# Patient Record
Sex: Female | Born: 1975 | Race: White | Hispanic: No | Marital: Single | Smoking: Never smoker
Health system: Southern US, Community
[De-identification: ages and names within clinical notes are randomized; demographics above are authoritative.]

## PROBLEM LIST (undated history)

## (undated) DIAGNOSIS — R519 Headache, unspecified: Secondary | ICD-10-CM

## (undated) DIAGNOSIS — O139 Gestational [pregnancy-induced] hypertension without significant proteinuria, unspecified trimester: Secondary | ICD-10-CM

## (undated) DIAGNOSIS — E282 Polycystic ovarian syndrome: Secondary | ICD-10-CM

## (undated) DIAGNOSIS — R51 Headache: Secondary | ICD-10-CM

## (undated) HISTORY — PX: OTHER SURGICAL HISTORY: SHX169

## (undated) HISTORY — PX: ARTHROSCOPIC REPAIR ACL: SUR80

---

## 2005-03-18 ENCOUNTER — Emergency Department: Payer: Self-pay | Admitting: Unknown Physician Specialty

## 2005-11-23 ENCOUNTER — Emergency Department: Payer: Self-pay | Admitting: Emergency Medicine

## 2007-06-02 ENCOUNTER — Emergency Department: Payer: Self-pay | Admitting: Unknown Physician Specialty

## 2007-10-21 ENCOUNTER — Emergency Department: Payer: Self-pay | Admitting: Emergency Medicine

## 2008-07-07 ENCOUNTER — Emergency Department: Payer: Self-pay | Admitting: Emergency Medicine

## 2009-06-14 ENCOUNTER — Emergency Department: Payer: Self-pay | Admitting: Emergency Medicine

## 2010-10-14 ENCOUNTER — Ambulatory Visit: Payer: Self-pay | Admitting: Family Medicine

## 2013-09-06 ENCOUNTER — Ambulatory Visit: Payer: Self-pay | Admitting: Family Medicine

## 2013-11-07 DIAGNOSIS — E663 Overweight: Secondary | ICD-10-CM | POA: Insufficient documentation

## 2014-02-27 ENCOUNTER — Emergency Department: Payer: Self-pay | Admitting: Emergency Medicine

## 2014-08-12 ENCOUNTER — Emergency Department: Payer: Self-pay | Admitting: Emergency Medicine

## 2014-08-12 LAB — COMPREHENSIVE METABOLIC PANEL
ALBUMIN: 3.8 g/dL (ref 3.4–5.0)
Alkaline Phosphatase: 79 U/L (ref 46–116)
Anion Gap: 9 (ref 7–16)
BUN: 12 mg/dL (ref 7–18)
Bilirubin,Total: 0.3 mg/dL (ref 0.2–1.0)
CALCIUM: 9.1 mg/dL (ref 8.5–10.1)
CHLORIDE: 110 mmol/L — AB (ref 98–107)
Co2: 21 mmol/L (ref 21–32)
Creatinine: 0.77 mg/dL (ref 0.60–1.30)
EGFR (African American): >60
EGFR (Non-African Amer.): >60
Glucose: 134 mg/dL — ABNORMAL HIGH (ref 65–99)
Osmolality: 281 (ref 275–301)
Potassium: 3.5 mmol/L (ref 3.5–5.1)
SGOT(AST): 32 U/L (ref 15–37)
SGPT (ALT): 46 U/L (ref 14–63)
SODIUM: 140 mmol/L (ref 136–145)
Total Protein: 7.8 g/dL (ref 6.4–8.2)

## 2014-08-12 LAB — CBC WITH DIFFERENTIAL/PLATELET
BASOS PCT: 0.8 %
Basophil #: 0.1 10*3/uL (ref 0.0–0.1)
Eosinophil #: 0.1 10*3/uL (ref 0.0–0.7)
Eosinophil %: 0.5 %
HCT: 40.3 % (ref 35.0–47.0)
HGB: 13.5 g/dL (ref 12.0–16.0)
LYMPHS ABS: 3 10*3/uL (ref 1.0–3.6)
LYMPHS PCT: 23.8 %
MCH: 31.9 pg (ref 26.0–34.0)
MCHC: 33.6 g/dL (ref 32.0–36.0)
MCV: 95 fL (ref 80–100)
MONOS PCT: 5.7 %
Monocyte #: 0.7 x10 3/mm (ref 0.2–0.9)
Neutrophil #: 8.8 10*3/uL — ABNORMAL HIGH (ref 1.4–6.5)
Neutrophil %: 69.2 %
Platelet: 276 10*3/uL (ref 150–440)
RBC: 4.24 10*6/uL (ref 3.80–5.20)
RDW: 12.2 % (ref 11.5–14.5)
WBC: 12.7 10*3/uL — ABNORMAL HIGH (ref 3.6–11.0)

## 2014-08-12 LAB — URINALYSIS, COMPLETE
Bacteria: NONE SEEN
Bilirubin,UR: NEGATIVE
Blood: NEGATIVE
GLUCOSE, UR: NEGATIVE mg/dL (ref 0–75)
Leukocyte Esterase: NEGATIVE
Nitrite: NEGATIVE
Ph: 8 (ref 4.5–8.0)
Protein: 30
Specific Gravity: 1.015 (ref 1.003–1.030)
WBC UR: NONE SEEN /HPF (ref 0–5)

## 2014-08-29 ENCOUNTER — Ambulatory Visit: Payer: Self-pay | Admitting: Urology

## 2014-09-05 ENCOUNTER — Ambulatory Visit: Payer: Self-pay

## 2014-10-11 ENCOUNTER — Ambulatory Visit
Admit: 2014-10-11 | Disposition: A | Discharge status: Home / Self Care | Payer: Self-pay | Attending: Urology | Admitting: Urology

## 2014-10-16 ENCOUNTER — Ambulatory Visit
Admit: 2014-10-16 | Disposition: A | Discharge status: Home / Self Care | Payer: Self-pay | Attending: Urology | Admitting: Urology

## 2014-10-16 LAB — CBC
HCT: 40.7 % (ref 35.0–47.0)
HGB: 13.5 g/dL (ref 12.0–16.0)
MCH: 31.5 pg (ref 26.0–34.0)
MCHC: 33.1 g/dL (ref 32.0–36.0)
MCV: 95 fL (ref 80–100)
Platelet: 237 10*3/uL (ref 150–440)
RBC: 4.29 10*6/uL (ref 3.80–5.20)
RDW: 12.6 % (ref 11.5–14.5)
WBC: 6.1 10*3/uL (ref 3.6–11.0)

## 2014-10-16 LAB — BASIC METABOLIC PANEL
Anion Gap: 9 (ref 7–16)
BUN: 15 mg/dL
CREATININE: 0.75 mg/dL
Calcium, Total: 8.8 mg/dL — ABNORMAL LOW
Chloride: 111 mmol/L
Co2: 20 mmol/L — ABNORMAL LOW
Glucose: 121 mg/dL — ABNORMAL HIGH
Potassium: 3.5 mmol/L
SODIUM: 140 mmol/L

## 2014-10-22 ENCOUNTER — Ambulatory Visit
Admit: 2014-10-22 | Disposition: A | Discharge status: Home / Self Care | Payer: Self-pay | Attending: Urology | Admitting: Urology

## 2014-10-24 ENCOUNTER — Observation Stay
Admit: 2014-10-24 | Disposition: A | Discharge status: Home / Self Care | Payer: Self-pay | Attending: Urology | Admitting: Urology

## 2014-10-24 LAB — CBC WITH DIFFERENTIAL/PLATELET
BASOS ABS: 0.1 10*3/uL (ref 0.0–0.1)
Basophil %: 0.6 %
Eosinophil #: 0.1 10*3/uL (ref 0.0–0.7)
Eosinophil %: 0.4 %
HCT: 40.4 % (ref 35.0–47.0)
HGB: 13.5 g/dL (ref 12.0–16.0)
Lymphocyte #: 1.6 10*3/uL (ref 1.0–3.6)
Lymphocyte %: 11 %
MCH: 32 pg (ref 26.0–34.0)
MCHC: 33.4 g/dL (ref 32.0–36.0)
MCV: 96 fL (ref 80–100)
MONO ABS: 0.7 x10 3/mm (ref 0.2–0.9)
Monocyte %: 4.8 %
NEUTROS PCT: 83.2 %
Neutrophil #: 11.8 10*3/uL — ABNORMAL HIGH (ref 1.4–6.5)
PLATELETS: 264 10*3/uL (ref 150–440)
RBC: 4.22 10*6/uL (ref 3.80–5.20)
RDW: 12.7 % (ref 11.5–14.5)
WBC: 14.2 10*3/uL — ABNORMAL HIGH (ref 3.6–11.0)

## 2014-10-24 LAB — BASIC METABOLIC PANEL
ANION GAP: 6 — AB (ref 7–16)
BUN: 15 mg/dL
Calcium, Total: 9 mg/dL
Chloride: 110 mmol/L
Co2: 25 mmol/L
Creatinine: 0.82 mg/dL
EGFR (African American): >60
EGFR (Non-African Amer.): >60
GLUCOSE: 107 mg/dL — AB
Potassium: 3.6 mmol/L
Sodium: 141 mmol/L

## 2014-10-24 LAB — URINALYSIS, COMPLETE
BACTERIA: NONE SEEN
Specific Gravity: 1.02 (ref 1.003–1.030)

## 2014-10-25 LAB — CBC WITH DIFFERENTIAL/PLATELET
BASOS ABS: 0.1 10*3/uL (ref 0.0–0.1)
Basophil %: 1 %
Eosinophil #: 0.2 10*3/uL (ref 0.0–0.7)
Eosinophil %: 2.2 %
HCT: 35.1 % (ref 35.0–47.0)
HGB: 11.5 g/dL — ABNORMAL LOW (ref 12.0–16.0)
LYMPHS PCT: 38.4 %
Lymphocyte #: 3.2 10*3/uL (ref 1.0–3.6)
MCH: 31.5 pg (ref 26.0–34.0)
MCHC: 32.7 g/dL (ref 32.0–36.0)
MCV: 96 fL (ref 80–100)
MONOS PCT: 9.8 %
Monocyte #: 0.8 x10 3/mm (ref 0.2–0.9)
NEUTROS PCT: 48.6 %
Neutrophil #: 4.1 10*3/uL (ref 1.4–6.5)
PLATELETS: 205 10*3/uL (ref 150–440)
RBC: 3.64 10*6/uL — AB (ref 3.80–5.20)
RDW: 12.9 % (ref 11.5–14.5)
WBC: 8.4 10*3/uL (ref 3.6–11.0)

## 2014-10-26 LAB — URINE CULTURE

## 2014-10-29 ENCOUNTER — Ambulatory Visit
Admit: 2014-10-29 | Disposition: A | Discharge status: Home / Self Care | Payer: Self-pay | Attending: Urology | Admitting: Urology

## 2014-11-07 ENCOUNTER — Ambulatory Visit
Admit: 2014-11-07 | Disposition: A | Discharge status: Home / Self Care | Payer: Self-pay | Attending: Obstetrics and Gynecology | Admitting: Obstetrics and Gynecology

## 2014-11-11 NOTE — Discharge Summary (Signed)
Dates of Admission and Diagnosis:  Date of Admission 24-Oct-2014   Date of Discharge 01-Jan-0001   Admitting Diagnosis Right flank pain; Right hydronephrosis   Final Diagnosis Right flank pain; Right hydronephrosis    Chief Complaint/History of Present Illness Patient is a 39yo female who presented yesterday with acute right flank pain.  She is s/p right ureteroscopy 10/22/14 by Dr. Elnoria Howard for removal of 31m distal ureteral stone.  Right ureteral stent was removed yesterday in office by Dr. HElnoria Howardand patient reports pain began a few after visit.  Her WBC 14, Cr 0.83, and UA showing TNTC RBC and WBCs on admission.  Renal UKore[place]onstrated moderately severe right hydronephrosis, slightly increased from previous CT scan.  She was admitted overnight for observation and pain control.   Allergies:  Codeine: Itching, Rash, N/V/Diarrhea  Hydrocodone: Itching    Routine Chem:  13-Apr-16 15:38   Result Comment UA DIPSTICK - Unable to obtain valid dipstick results  - due to interference of gross blood in the  - specimen.  Result(s) reported on 24 Oct 2014 at 04:06PM.    16:56   Glucose, Serum  107 (65-99 NOTE: New Reference Range  09/18/14)  BUN 15 (6-20 NOTE: New Reference Range  09/18/14)  Creatinine (comp) 0.82 (0.44-1.00 NOTE: New Reference Range  09/18/14)  Sodium, Serum 141 (135-145 NOTE: New Reference Range  09/18/14)  Potassium, Serum 3.6 (3.5-5.1 NOTE: New Reference Range  09/18/14)  Chloride, Serum 110 (101-111 NOTE: New Reference Range  09/18/14)  CO2, Serum 25 (22-32 NOTE: New Reference Range  09/18/14)  Calcium (Total), Serum 9.0 (8.9-10.3 NOTE: New Reference Range  09/18/14)  Anion Gap  6  eGFR (African American) >60  eGFR (Non-African American) >60 (eGFR values <629mmin/1.73 m2 may be an indication of chronic kidney disease (CKD). Calculated eGFR is useful in patients with stable renal function. The eGFR calculation will not be reliable in acutely ill  patients when serum creatinine is changing rapidly. It is not useful in patients on dialysis. The eGFR calculation may not be applicable to patients at the low and high extremes of body sizes, pregnant women, and vegetarians.)  Routine UA:  13-Apr-16 15:38   Color (UA) RED  Clarity (UA) CLOUDY  Glucose (UA) see comment  Bilirubin (UA) see comment  Ketones (UA) see comment  Specific Gravity (UA) 1.020  Blood (UA) see comment  pH (UA) see comment  Protein (UA) see comment  Nitrite (UA) SEE COMMENT  Leukocyte Esterase (UA) see comment  RBC (UA) TNTC  WBC (UA) TNTC  Bacteria (UA) NONE SEEN  Epithelial Cells (UA) 0-5  Mucous (UA) PRESENT (Result(s) reported on 24 Oct 2014 at 04:06PM.)  Routine Hem:  13-Apr-16 16:56   WBC (CBC)  14.2  RBC (CBC) 4.22  Hemoglobin (CBC) 13.5  Hematocrit (CBC) 40.4  Platelet Count (CBC) 264  MCV 96  MCH 32.0  MCHC 33.4  RDW 12.7  Neutrophil % 83.2  Lymphocyte % 11.0  Monocyte % 4.8  Eosinophil % 0.4  Basophil % 0.6  Neutrophil #  11.8  Lymphocyte # 1.6  Monocyte # 0.7  Eosinophil # 0.1  Basophil # 0.1 (Result(s) reported on 24 Oct 2014 at 05:16PM.)  14-Apr-16 05:40   WBC (CBC) 8.4  RBC (CBC)  3.64  Hemoglobin (CBC)  11.5  Hematocrit (CBC) 35.1  Platelet Count (CBC) 205  MCV 96  MCH 31.5  MCHC 32.7  RDW 12.9  Neutrophil % 48.6  Lymphocyte % 38.4  Monocyte % 9.8  Eosinophil %  2.2  Basophil % 1.0  Neutrophil # 4.1  Lymphocyte # 3.2  Monocyte # 0.8  Eosinophil # 0.2  Basophil # 0.1 (Result(s) reported on 25 Oct 2014 at [facility].)   PERTINENT RADIOLOGY STUDIES: [place]:    13-Apr-16 17:36, US Kidney Bilateral  US Kidney Bilateral   REASON FOR EXAM:    had ureteral stent removed today not w/ return of   severe pain on r most c/w rena  COMMENTS:       PROCEDURE: [place]  - US KIDNEY  - Oct 24 2014  5:36PM     CLINICAL DATA:  Right-sided pain.  Removal of double-J stent    EXAM:  RENAL/URINARY TRACT ULTRASOUND  COMPLETE    COMPARISON:  Renal ultrasound October 11, 2014; CT abdomen and pelvis  August 12, 2014    FINDINGS:  Right Kidney:    Length: 12.4 cm. Echogenicity and renal cortical thickness are  within normal limits. No mass orperinephric fluid visualized. There  is moderately severe hydronephrosis on the right without  demonstrable ureterectasis. No calculus appreciable.    Left Kidney:    Length: 12.4 cm. Echogenicity and renal cortical thickness are  within normal limits. No mass perinephric fluid, or hydronephrosis  visualized. No sonographically demonstrable calculus or  ureterectasis.    Bladder:  Appears normal for degree of bladder distention. Flow from each  ureter is seen in the urinary bladder by Doppler evaluation.     IMPRESSION:  Moderately severe hydronephrosis on the right, increased from most  recent CT sonographic examination and similar to prior CT. Study  otherwise unremarkable.      Electronically Signed    By: Lowella Grip III M.D.    On:10/24/2014 18:41         Verified By: Leafy Kindle. WOODRUFF, M.D.,   Pertinent Past History:  Pertinent Past History 1) Depression/Anxiety 2) Migraines 3) Renal stones   Hospital Course:  Hospital Course Uncomplicated.  Patient reports continued right flank soreness but states that it has improved from 10/10 now to 6/10.  WBC down from 14 to 8.  If patient can tolerate po and pain continues to improve she will be discharged with close outpatient f/u. We will obtain CT A/P without contrast in 4 days 10/29/14 and follow up in our office for recheck and review results mid to late next week. Patient to seek immediate medical attention for fevers, difficulty urinating or uncontrolled pain/vomiting.   Condition on Discharge Fair   Code Status:  Code Status Full Code   DISCHARGE INSTRUCTIONS HOME MEDS:  Medication Reconciliation: Patient's Home Medications at Discharge:     Medication Instructions  lexapro 10  mg oral tablet  1 tab(s) orally once a day   zofran odt 4 mg oral tablet, disintegrating  1 tab(s) orally 3 times a day, As Needed   topiramate extended release 150 mg oral capsule, extended release  1 cap(s) orally once a day (at bedtime)   flomax 0.4 mg oral capsule  1 cap(s) orally once a day   oxybutynin 5 mg oral tablet  1 tab(s) orally 3 times a day - for Bladder Spasms   dilaudid 2 mg oral tablet  1 tab(s) orally every 4 hours as needed for pain   colace sodium 100 mg oral capsule  1 cap(s) orally 2 times a day to prevent constipation while taking pain medications   cipro 500 mg oral tablet  1 tab(s) orally every 12 hours   acetaminophen 325 mg oral  tablet  2 tab(s) orally every 4 hours, As needed, mild pain (1-3/10) or temp. greater than 100.4    PRESCRIPTIONS: PRINTED AND PLACED ON CHART   Physician's Instructions:  Home Health? No   Treatments None   Home Oxygen? No   Diet Regular   Dietary Supplements None   Diet Consistency Regular Consistency   Activity Limitations As tolerated   Return to Work after follow up visit with MD   Time frame for Follow Up Appointment 1-2 weeks  1 week after repeat renal ultrasound at [facility](Ordered): [place], [address], [place], [place], [place] [address], [place]:  Total Time: 30 minutes or less   Electronic Signatures: Sherlynn Stalls (MD)   (Signed 14-Apr-16 12:21)  Co-Signer: ADMISSION DATE AND DIAGNOSIS, CHIEF COMPLAINT/HPI, PERTINENT LABS, [place], PATIENT INSTRUCTIONS, Allergies, PERTINENT PAST HISTORY, TIME SPENT, Follow Up Physician Herbert Moors (NP)   (Signed 14-Apr-16 11:20)  Authored: ADMISSION DATE AND DIAGNOSIS, CHIEF COMPLAINT/HPI, PERTINENT LABS, PERTINENT RADIOLOGY STUDIES, HOSPITAL COURSE, DISCHARGE INSTRUCTIONS HOME MEDS, PATIENT INSTRUCTIONS, Allergies, PERTINENT  PAST HISTORY, TIME SPENT, Follow Up Physician  Last Updated: 14-Apr-16 12:21 by Sherlynn Stalls (MD)

## 2014-11-11 NOTE — H&P (Signed)
PATIENT NAME:  Rita White, Rita White MR#:  [id number] DATE OF BIRTH:  1975/07/23  DATE OF ADMISSION:  10/24/2014  ADMITTING PHYSICIAN:  Vanna Scotland, MD.    REQUESTING CONSULTANT:  Dorothea Glassman, MD from the Emergency Room.   REASON FOR ADMISSION:  Right flank pain, right hydronephrosis.   HISTORY OF PRESENT ILLNESS:  This is a 39 year old female who presented in late January with acute onset right flank pain, found to have a 4 mm distal ureteral stone. This stone failed to pass and she ultimately underwent a right ureteroscopy, laser lithotripsy, right ureteral stent placement on Monday 10/22/2014 with Dr. Edwyna Shell. She subsequently returned to the office today and underwent cystoscopy, stent removal, which was performed with minimal difficulty. She did not receive any antibiotics at time of stent removal. Several hours later she developed acute onset severe right flank pain and ultimately presented to the Emergency Room. She denies any fevers, chills, nausea, or vomiting. Her pain has been somewhat difficult to control in the ER requiring several doses of IV morphine. She does have a slightly elevated white count to 14.2, creatinine at 0.83. Her UA is grossly red with too numerous to count red and white cells, but no obvious bacteria or epithelials. She did also undergo a renal ultrasound which shows a moderately severe hydronephrosis on the right, slightly increased from a previous CT scan.   She denies any significant urinary symptoms other than occasional bladder spasms and gross hematuria.   PAST MEDICAL HISTORY:   1.  Depression/anxiety.   2.  Migraines.   PAST SURGICAL HISTORY:  1.  Right ureteroscopy, laser lithotripsy on 10/22/2014.  2.  Knee surgery.  3.  LEEP procedure.   ALLERGIES: CODEINE, HYDROCODONE.   HOME MEDICATIONS:  1.  Lexapro 10 mg daily.  2   Topiramate 500 mg at bedtime.    SOCIAL HISTORY: The patient is a nonsmoker. She drinks occasional social alcohol. No drug use.    FAMILY HISTORY: The patient does have a positive family history of upper tract urothelial carcinoma as well as kidney stones in her mother.   REVIEW OF SYSTEMS: A 12. review of systems was performed and is otherwise negative other than as per HPI.    PHYSICAL EXAMINATION:   VITAL SIGNS: Temperature 97.3, pulse 67, respirations 18, blood pressure 123/77, 99% on room air.  GENERAL: No acute distress, alert and oriented.  HEENT: Moist mucous membranes. Normocephalic, atraumatic.  NECK: Supple. No masses. Trachea is midline.  LUNGS: Clear to auscultation bilaterally.  HEART: Regular rate and rhythm. No lower extremity edema noted.  ABDOMEN: Soft. Mild tenderness in right lower quadrant with voluntary guarding. No rebound or rigidity. Slight right CVA tenderness.  SKIN: No rashes, lesions.  Bruise in the right forearm from previous IV.  NEUROLOGIC: Cranial nerves grossly intact. Moving all 4 extremities. No focal deficits.  EXTREMITIES: No lower extremity edema bilaterally.  LYMPHATIC SYSTEM:  No inguinal or axillary cervical lymphadenopathy noted.   LABORATORY DATA: As per HPI,  renal ultrasound from today was reviewed which again shows moderately severe hydronephrosis on the right slightly increased from previous CT scan and there are no stones noted within each of the kidneys, of note there was also no perinephric fluid visualized. The images were reviewed personally by myself.   ASSESSMENT AND PLAN: This is a 39 year old female with a right ureteral stone status post ureteroscopy, laser lithotripsy, and stent placement followed by stent removal 2 days later, she developed severe acute right flank pain  following stent removal this afternoon, and renal ultrasound shows a moderate to severe right hydronephrosis. She does have a borderline white count and her urine is somewhat suspicious, although it is difficult to make an assessment following this procedure. I suspect she has some ureteral edema  related to a chronically impacted stone and subsequent stent removal with resulting hydronephrosis. Given her poorly controlled pain and borderline white count I feel that it is reasonable to admit her for IV antibiotics and observation. If she feels improved we will plan for a CT scan to assess for any residual stone fragments, although I think this is unlikely. If her pain remains severe we will plan for ureteral stent placement tomorrow and we will make her n.p.o. at midnight. I have discussed this plan with the attending in the Emergency Room.     ____________________________ Claris GladdenAshley J. Loren Sawaya, MD ajb:bu D: 10/24/2014 20:12:14 ET T: 10/24/2014 20:27:31 ET JOB#: [id number]  cc: Claris GladdenAshley J. Tamana Hatfield, MD, <Dictator> Claris GladdenASHLEY J Henry Demeritt MD ELECTRONICALLY SIGNED 11/01/2014 14:43

## 2014-11-11 NOTE — Op Note (Signed)
PATIENT NAME:  Gertie ExonCHURCH, Tavonna M MR#:  [id number] DATE OF BIRTH:  1976/03/17  DATE OF PROCEDURE:  10/22/2014  PREOPERATIVE DIAGNOSIS: Distal right ureteral calculus.   POSTOPERATIVE DIAGNOSIS:  Distal right ureteral calculus with calculus gone.   PROCEDURE: Cystourethroscopy on the right, left retrograde pyelogram, and right ureteral stent with right ureteral dilatation.   ANESTHESIA: General.   FINDINGS ARE AS FOLLOWS:  With the patient sterilely prepped and draped in supine lithotomy position, after an appropriate timeout, I instrumented the bladder through the urethra with a 21 French Foroblique lens cystoscope. What was suspected to be a calculus was seen in the distal right and we bypassed this calcification with a wire. A wire goes up easily, so no obstruction. So, I do a cystoscopy.  There was a lot of edema where the calculus was.  So, I put a 2nd wire in through the ureteroscope which was a 7 [place] telescoping direct vision ureteroscope and up over the wire.  The ureter seemed to be clear of calculi. I go up twice, once after this initial time over the wire, and a second time after dilating the distal ureter which is quite edematous.  I find no calculi. I put a stent in over the wire which is a 0.035 sensor wire.  I decided to do a left retrograde pyelograms.  The ureters were small, thin, tiny.  No swelling, edema, or evidence of stone. The patient complained of some vague left-sided pain.  Thought she may have made had a calculus on the left.  When this is done, I am pleased with the position and stent and bladder is emptied.  Thirty mL of Marcaine were placed in the bladder and the patient is sent to recovery in satisfactory condition with a B and O suppository. She was given intraoperatively 15 mg of Toradol IV. This should help with pain control. The stent can be pulled out.   ____________________________ Caralyn Guileichard D. Edwyna ShellHart, DO rdh:sp D: 10/22/2014 12:13:54 ET T: 10/22/2014 16:23:47  ET JOB#: [id number]  cc: Caralyn Guileichard D. Edwyna ShellHart, DO, <Dictator> RICHARD D HART DO ELECTRONICALLY SIGNED 11/05/2014 10:24

## 2015-01-22 ENCOUNTER — Encounter: Payer: Self-pay | Admitting: Urology

## 2015-01-22 ENCOUNTER — Ambulatory Visit (INDEPENDENT_AMBULATORY_CARE_PROVIDER_SITE_OTHER): Payer: Medicaid Other | Admitting: Urology

## 2015-01-22 DIAGNOSIS — R350 Frequency of micturition: Secondary | ICD-10-CM

## 2015-01-22 DIAGNOSIS — R351 Nocturia: Secondary | ICD-10-CM

## 2015-01-22 DIAGNOSIS — N1330 Unspecified hydronephrosis: Secondary | ICD-10-CM

## 2015-01-22 NOTE — Progress Notes (Signed)
   01/22/2015 7:54 PM   Lissy M Sabey 10/15/1975 [id number]  Referring provider: Titus MouldElizabeth Burney White, NP [address] [address] [place], [place] [address]  Chief Complaint  Patient presents with  . Hydronephrosis    f/u RUS in 2 months to ensure complete resolution of hydronephrosis, fullness. The pt appt was cancelled &rescheduled today, because she was asymptomic and she had not had her RUS.     Patient not seen today, did not get ultrasound which was purpose of visit to assess for full resolution of residual right renal fullness.  No complains today, feeling well. Ultrasound ordered and arranged.  Plan to call patient with results of ultrasound.    Vanna ScotlandAshley Briauna Gilmartin, MD

## 2015-02-26 ENCOUNTER — Ambulatory Visit: Admission: RE | Admit: 2015-02-26 | Payer: Medicaid Other | Source: Ambulatory Visit

## 2015-09-14 IMAGING — CR DG ABDOMEN 1V
1 series · 2 of 2 positions shown · non-contrast
Comparison: CT scan of the abdomen and pelvis of August 12, 2014.

CLINICAL DATA: Lower abdominal and right flank pain, possible
urinary tract stones, clinical constipation possibly related to pain
medication

EXAM:
ABDOMEN - 1 VIEW

[Series 1: kdxr kidney ureter bladder · 0.14mm/px · 2 of 2 slices shown]
[im 1/2]
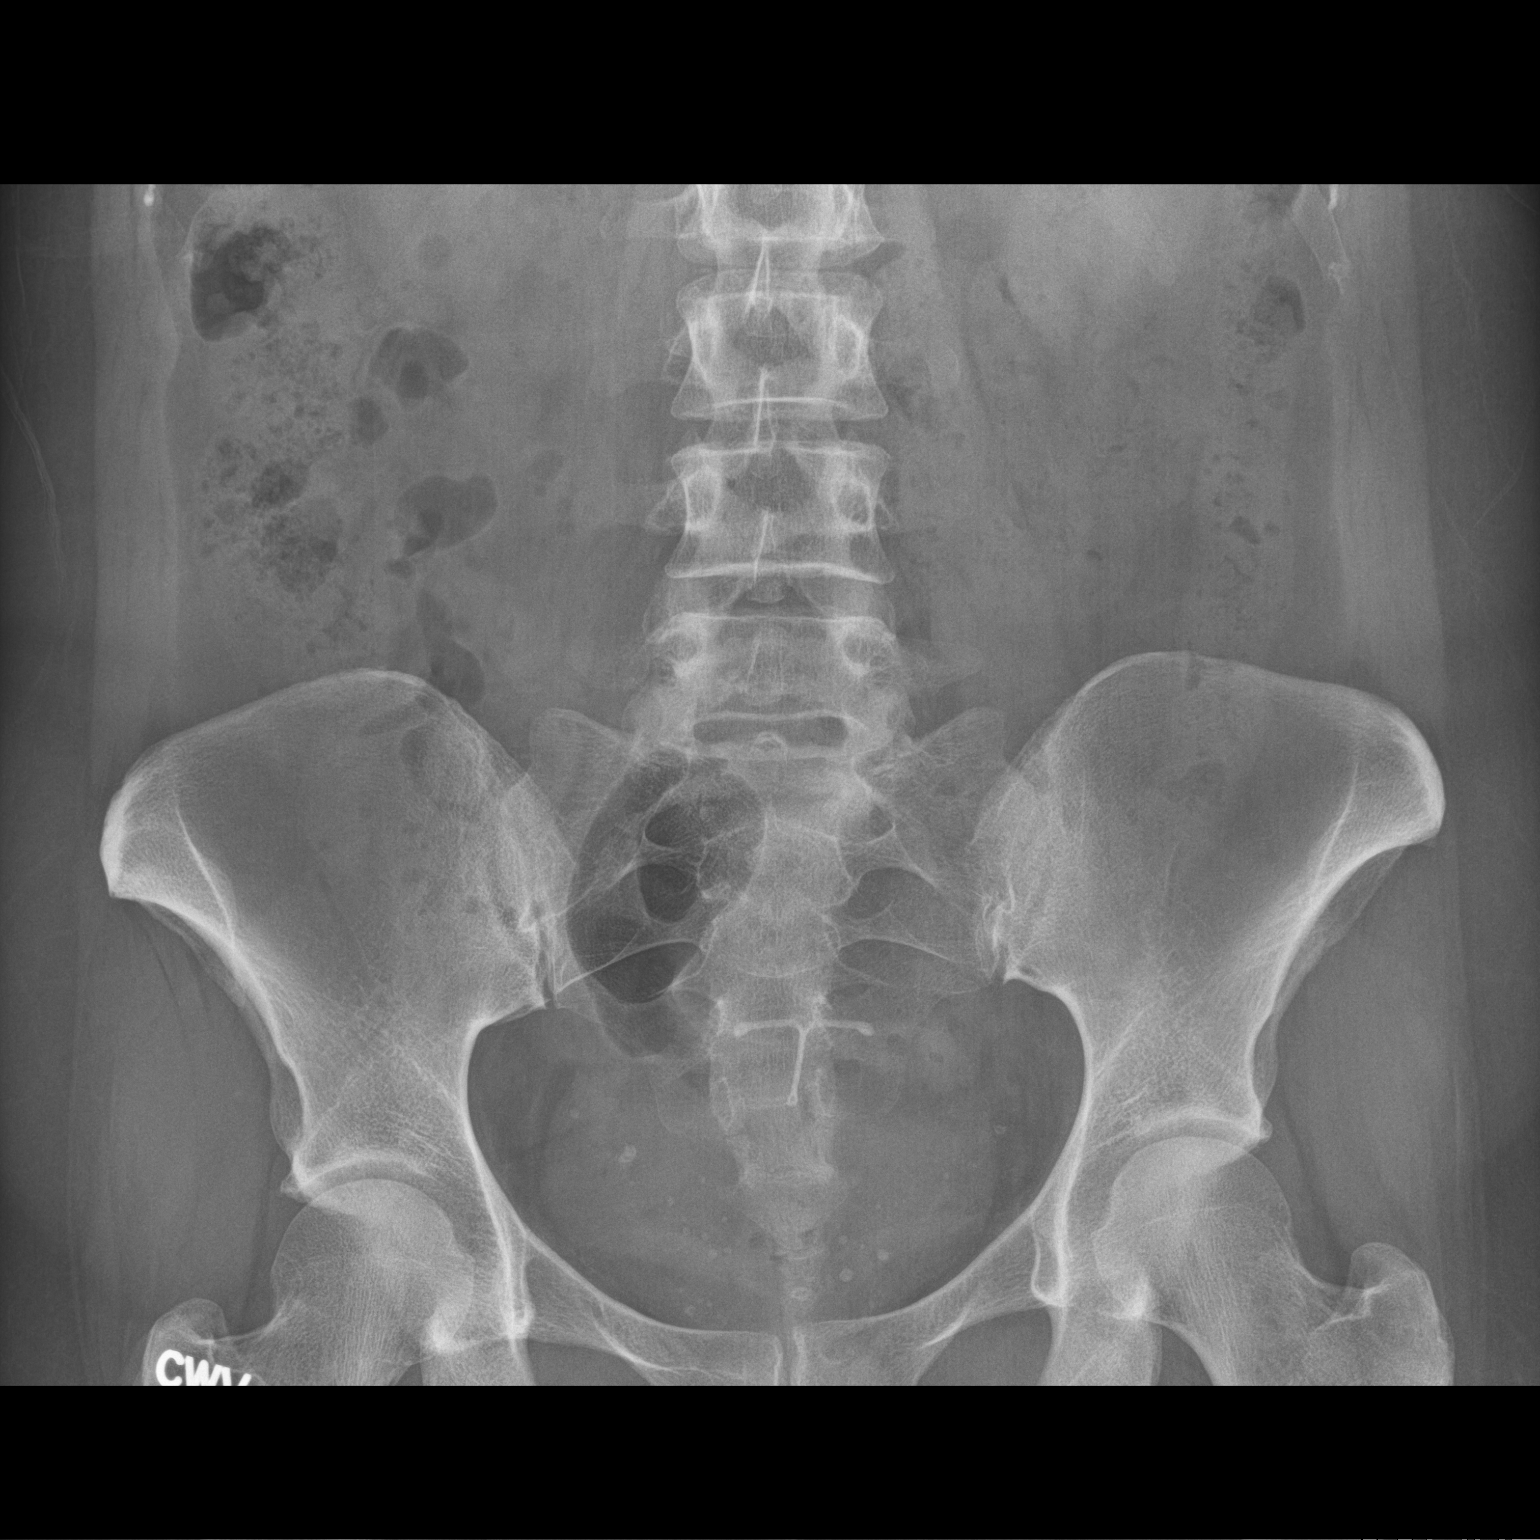
[im 2/2]
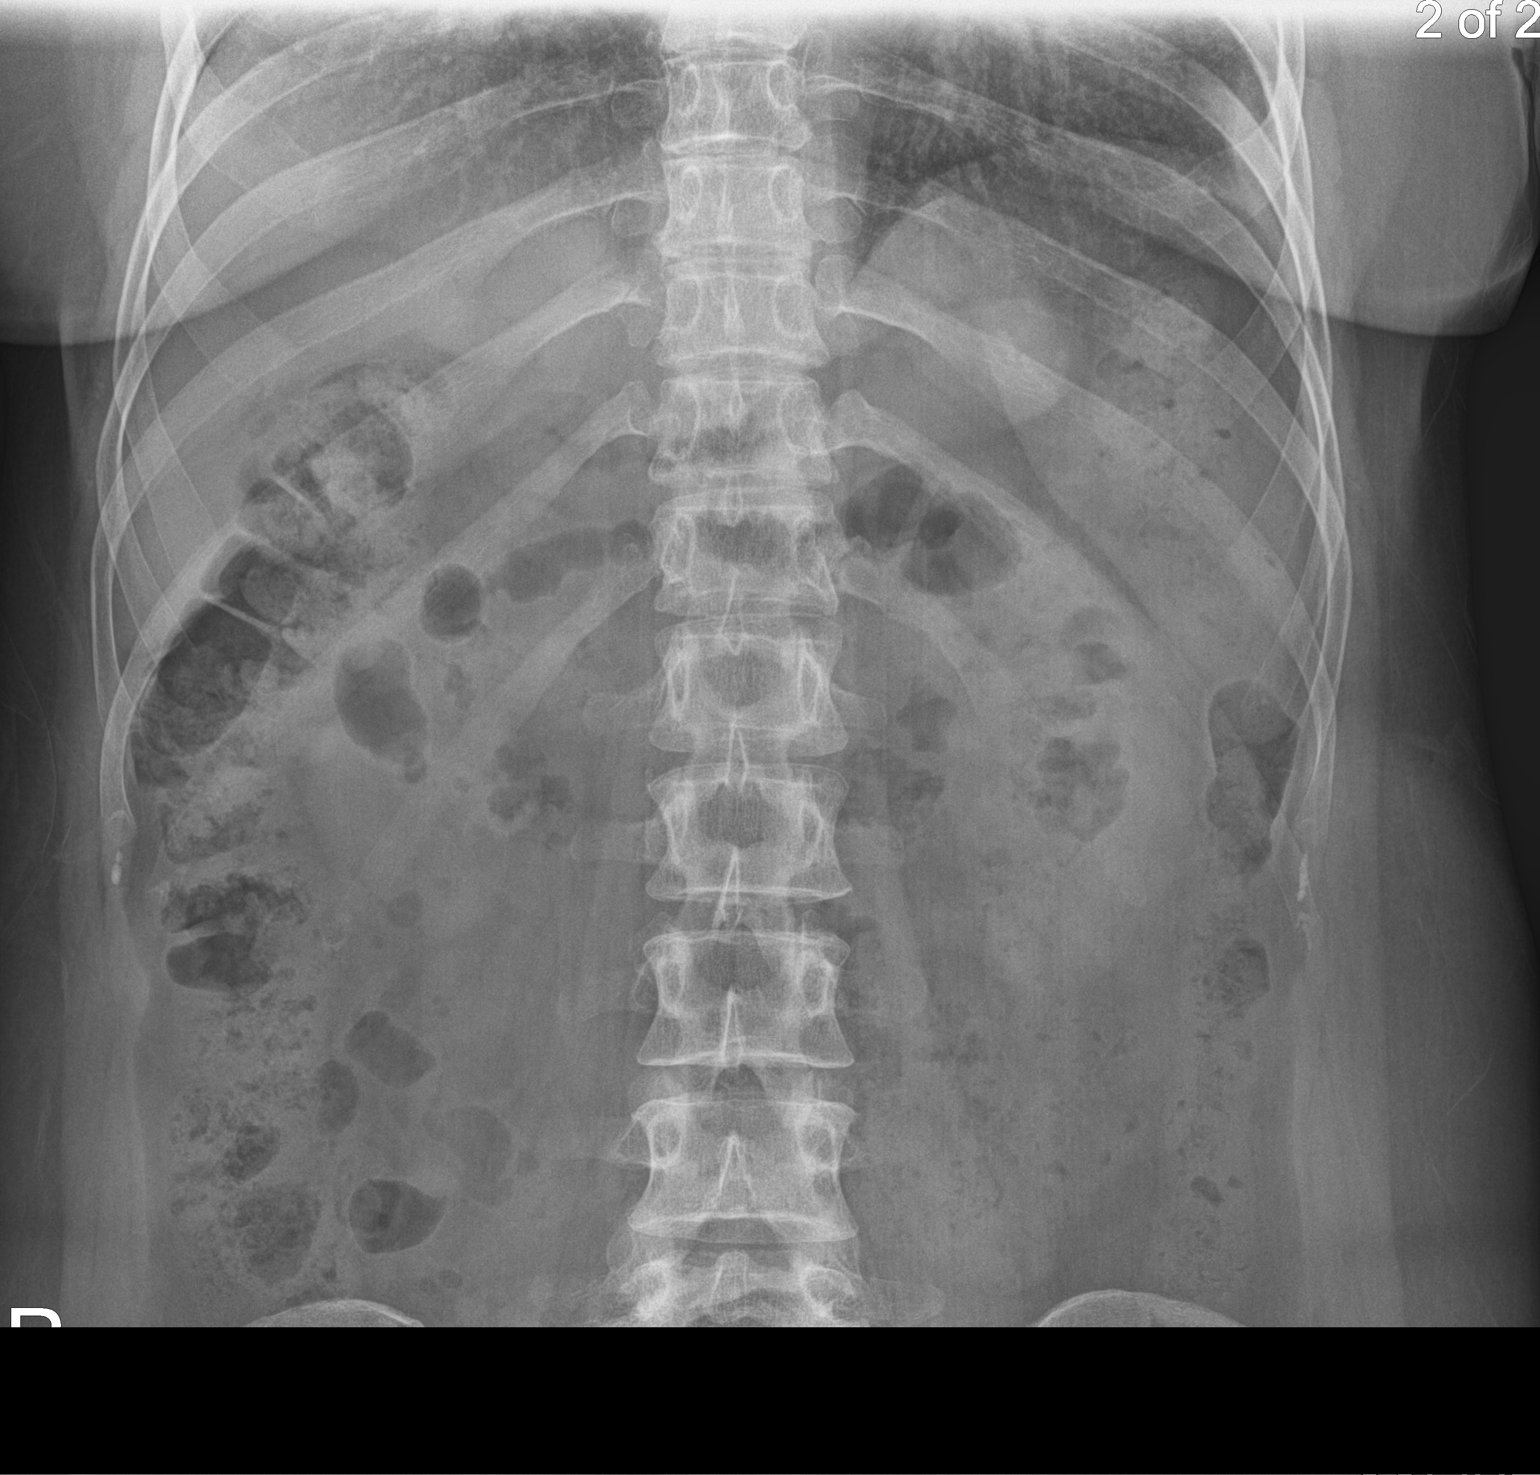

[2 of 2 positions shown; findings below may reference images not displayed]

FINDINGS: There is moderately increased stool burden throughout the colon.
There is no evidence of a large or small bowel obstruction. There
are numerous calcifications within the pelvis consistent with
phleboliths. A previously demonstrated stone at the left ureteral
right ureterovesical junction cannot be separated from adjacent
phleboliths. An IUD is present. The lumbar spine and bony pelvis are
unremarkable.
IMPRESSION: 1. Increased colonic stool burden consistent with clinical
constipation. There is no bowel abnormality otherwise.
2. Multiple pelvic phleboliths. A discrete distal ureteral stone is
not visible. Given the previous right-sided hydronephrosis,
follow-up renal ultrasound would be useful to assess the status of
the right kidney.

## 2015-09-21 IMAGING — US US RENAL KIDNEY
1 series · 14 of 25 positions shown · non-contrast
Comparison: CT scan from 08/12/2014.

CLINICAL DATA: Subsequent encounter for right-sided hydronephrosis.

EXAM:
RENAL/URINARY TRACT ULTRASOUND COMPLETE

[Series 1: us renal kidney · 0.20mm/px · 14 of 44 slices shown]
[im 1/44]
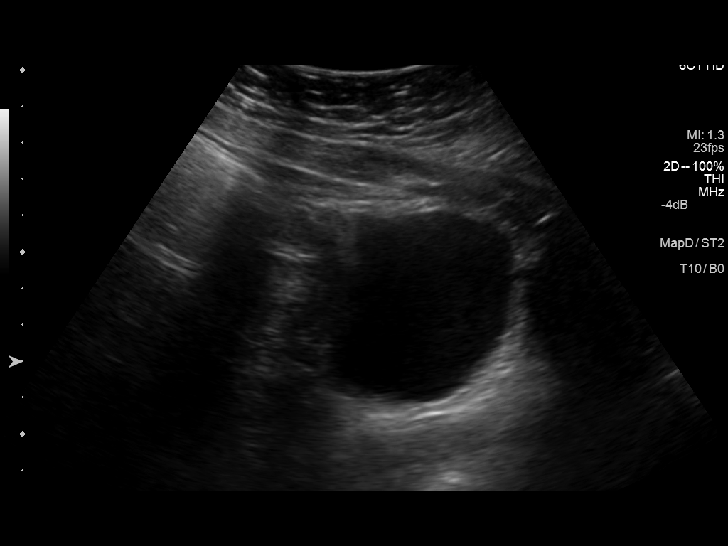
[im 4/44]
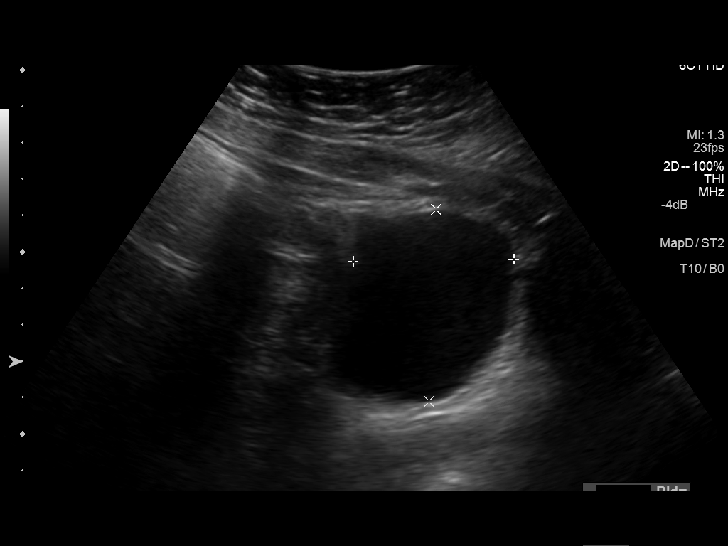
[im 8/44]
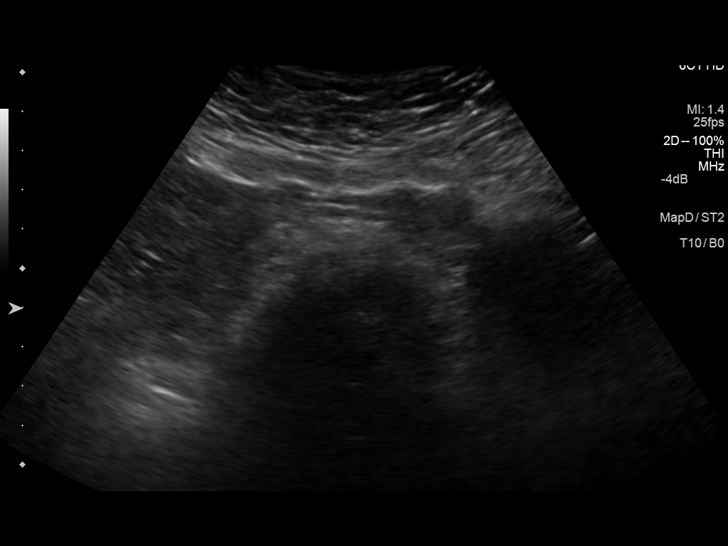
[im 11/44]
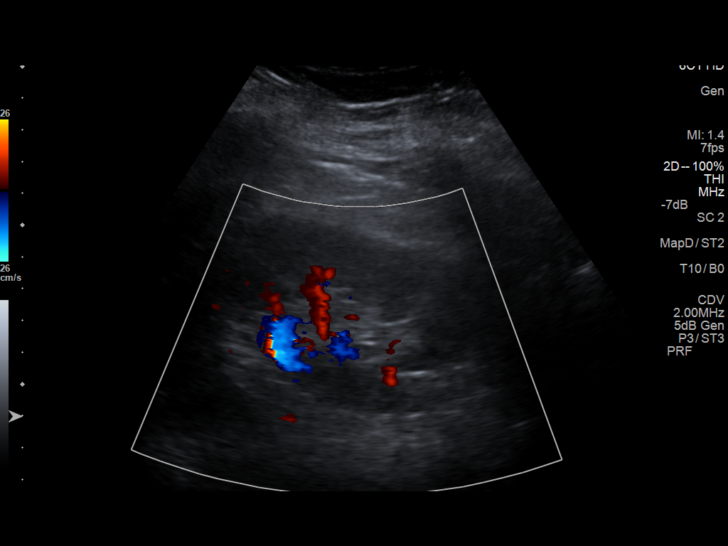
[im 15/44]
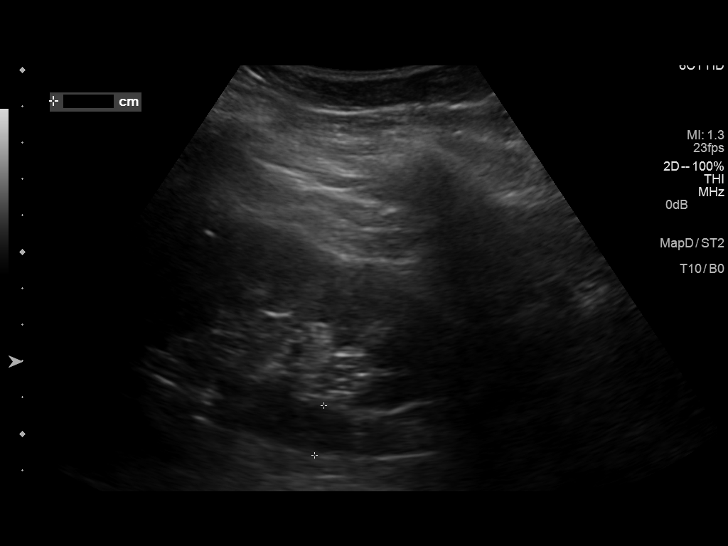
[im 17/44]
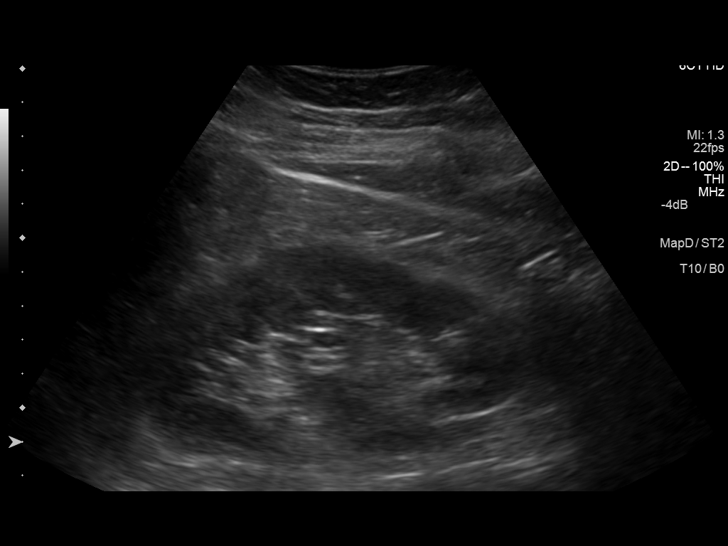
[im 20/44]
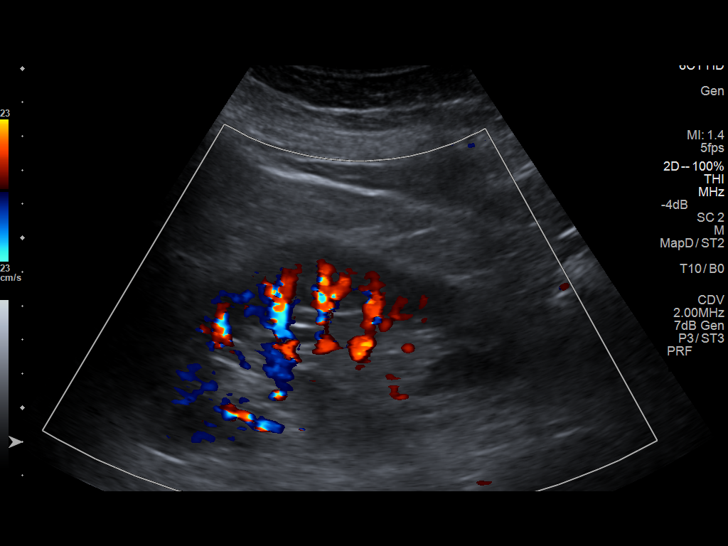
[im 24/44]
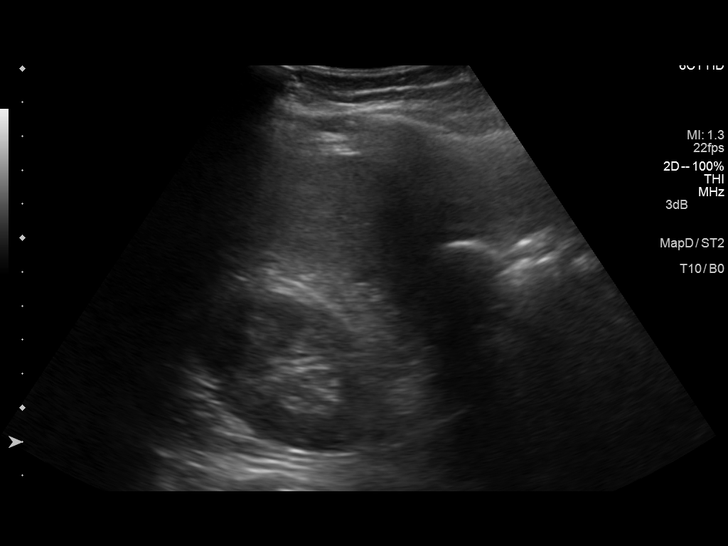
[im 27/44]
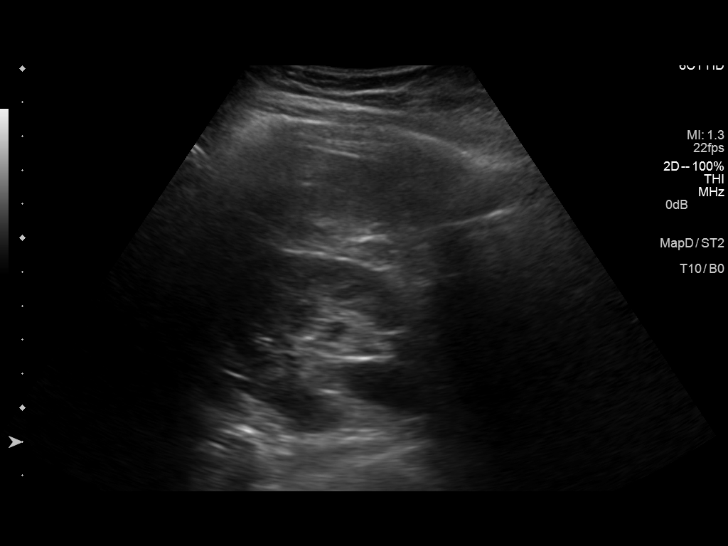
[im 29/44]
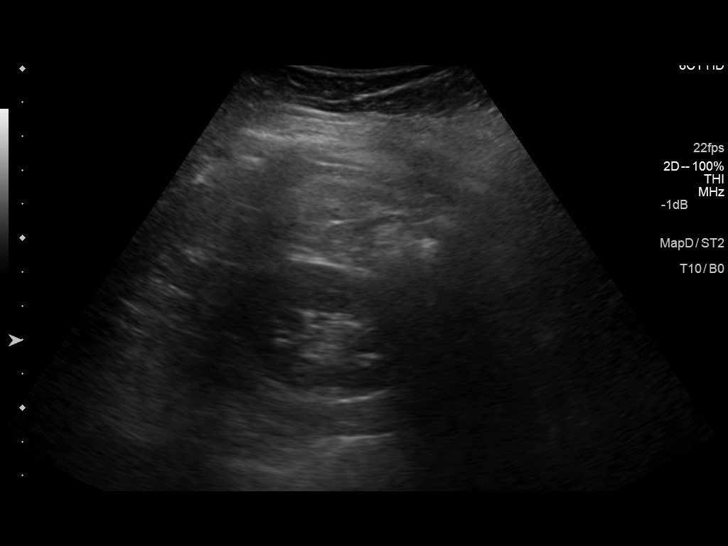
[im 33/44]
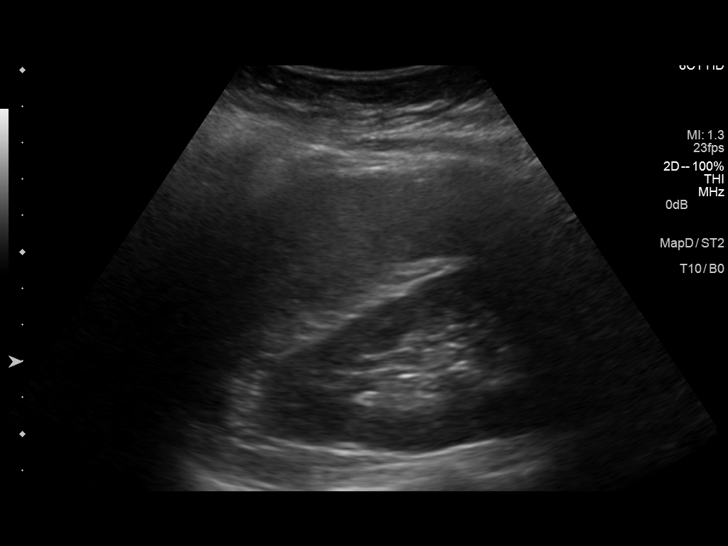
[im 36/44]
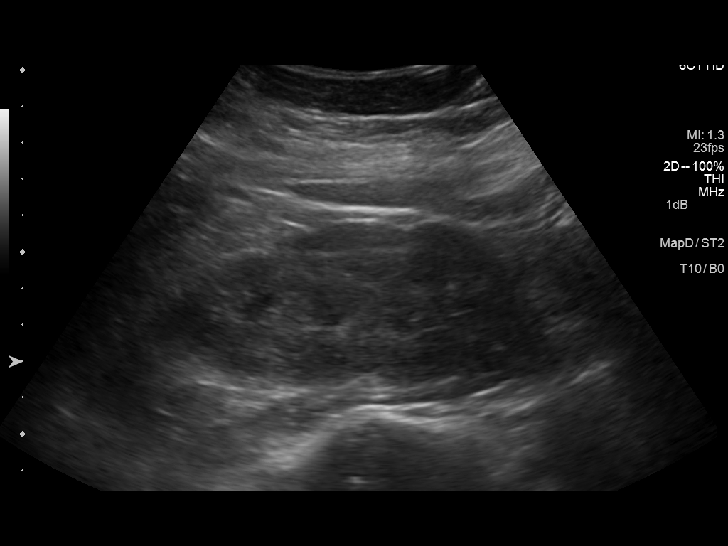
[im 40/44]
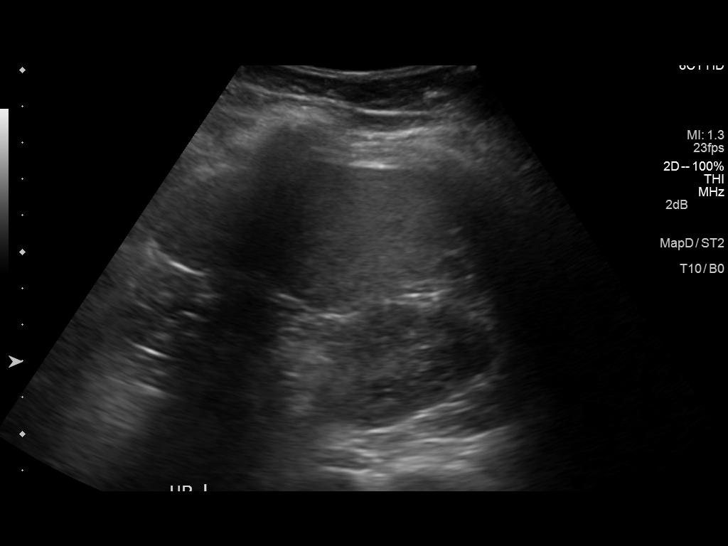
[im 44/44]
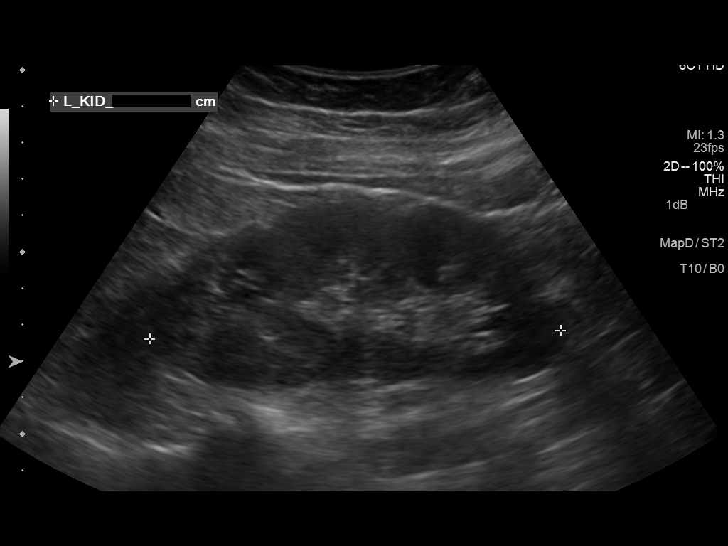

[14 of 25 positions shown; findings below may reference images not displayed]

FINDINGS: Right Kidney:

Length: 11.8 cm. Mild fullness of the right intrarenal collecting
system noted without overt hydronephrosis at this time.

Left Kidney:

Length: 12.2 cm. Echogenicity within normal limits. No mass or
hydronephrosis visualized.

Bladder:

Appears normal for degree of bladder distention.
IMPRESSION: Mild fullness of the right intrarenal collecting system without
overt hydronephrosis.

## 2015-11-23 IMAGING — US US RENAL KIDNEY
1 series · 14 of 25 positions shown · non-contrast
Comparison: CT scan dated October 29, 2014

CLINICAL DATA: History of right-sided kidney stone and
hydronephrosis.

EXAM:
RENAL / URINARY TRACT ULTRASOUND COMPLETE

[Series 1: us renal kidney · 0.26mm/px · 14 of 50 slices shown]
[im 1/50]
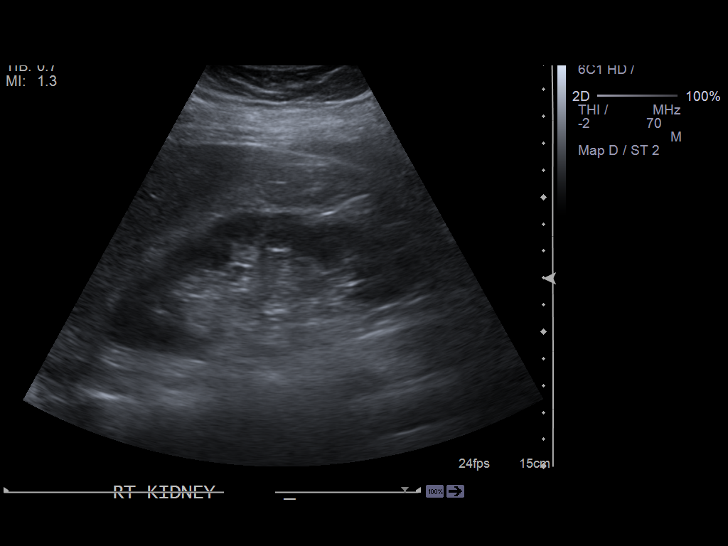
[im 5/50]
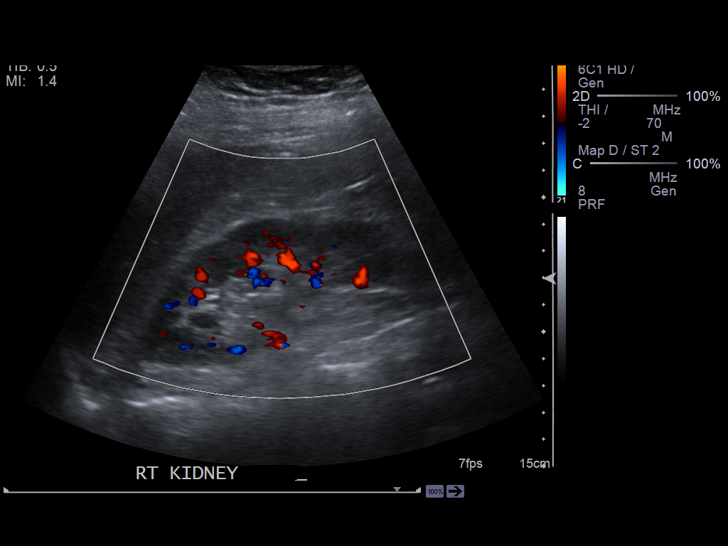
[im 9/50]
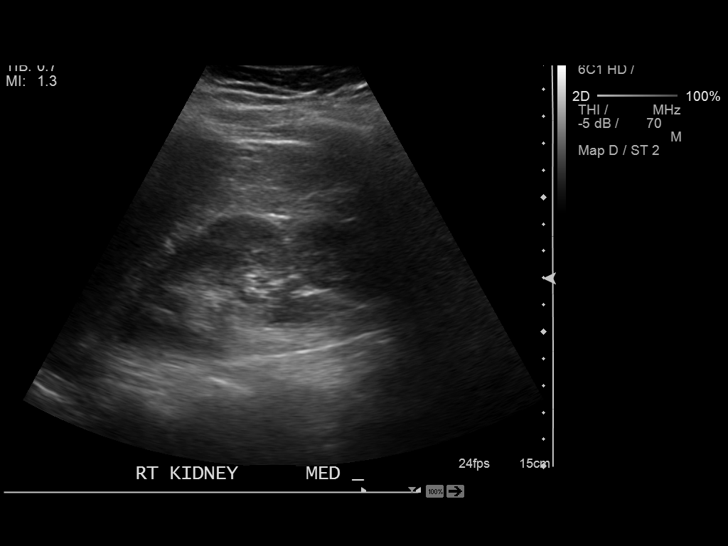
[im 13/50]
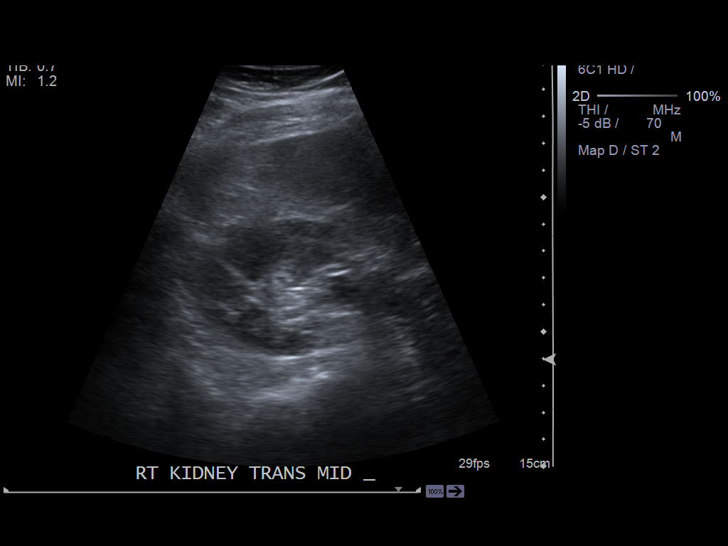
[im 17/50]
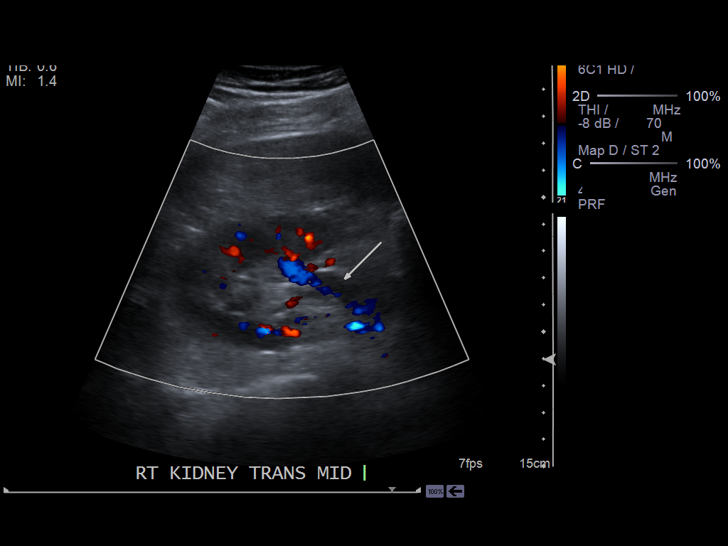
[im 19/50]
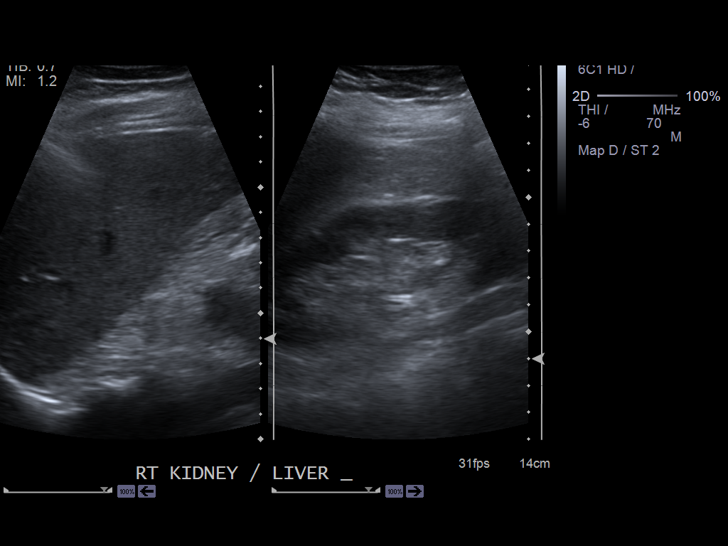
[im 23/50]
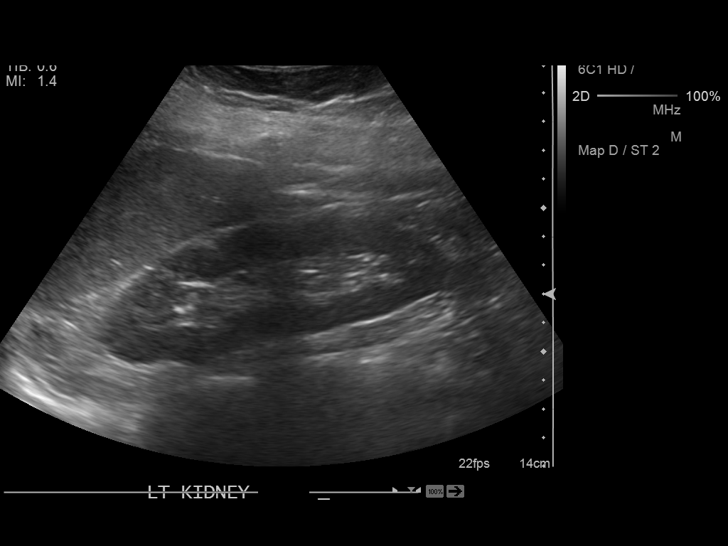
[im 27/50]
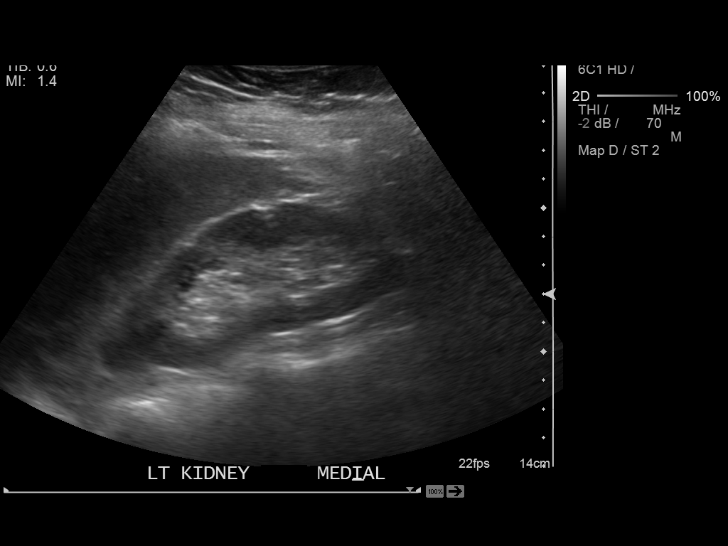
[im 31/50]
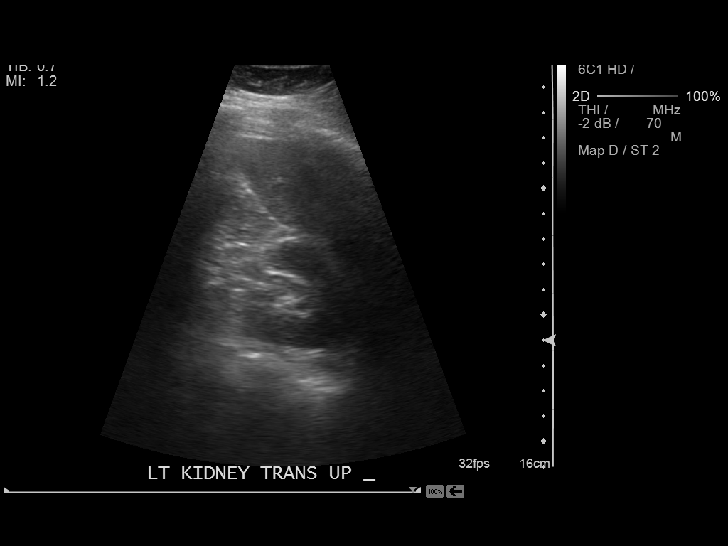
[im 33/50]
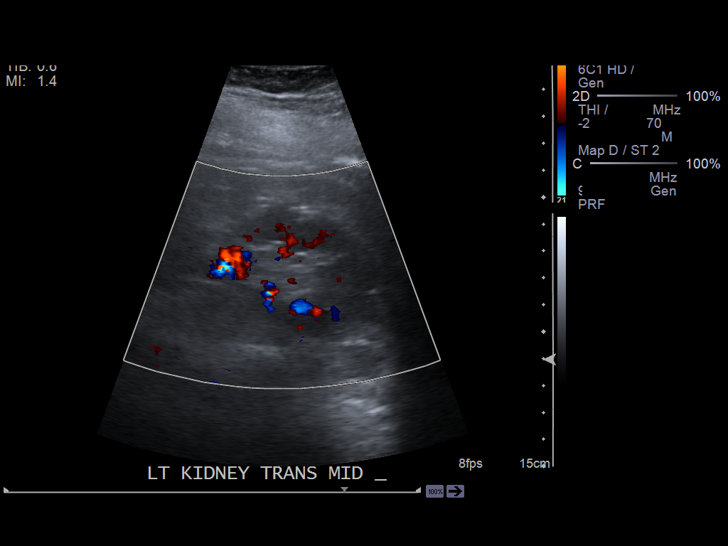
[im 37/50]
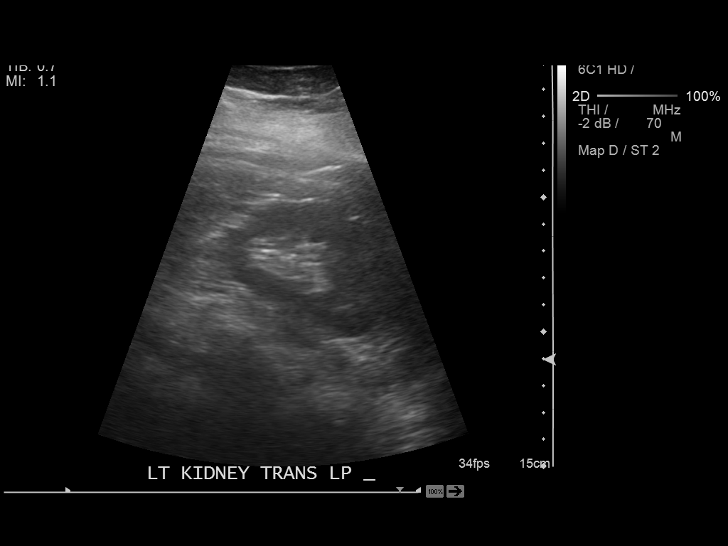
[im 41/50]
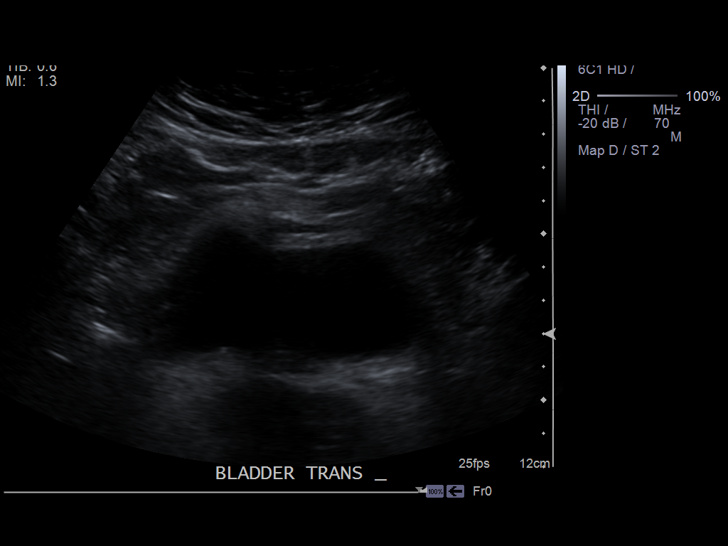
[im 45/50]
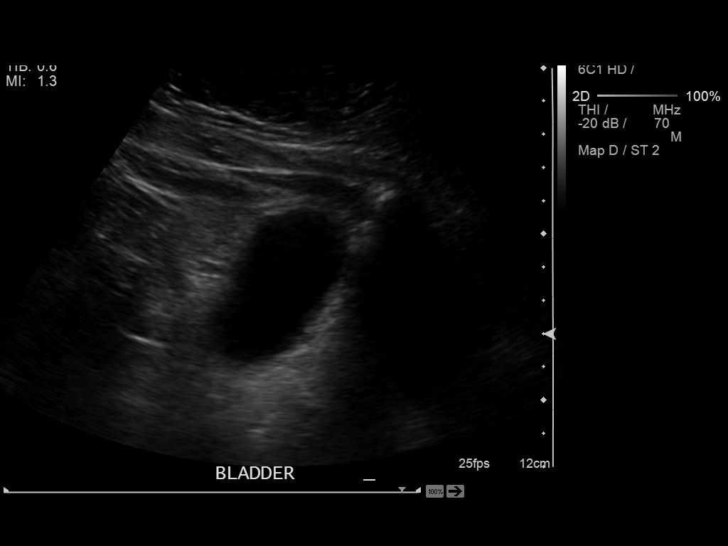
[im 50/50]
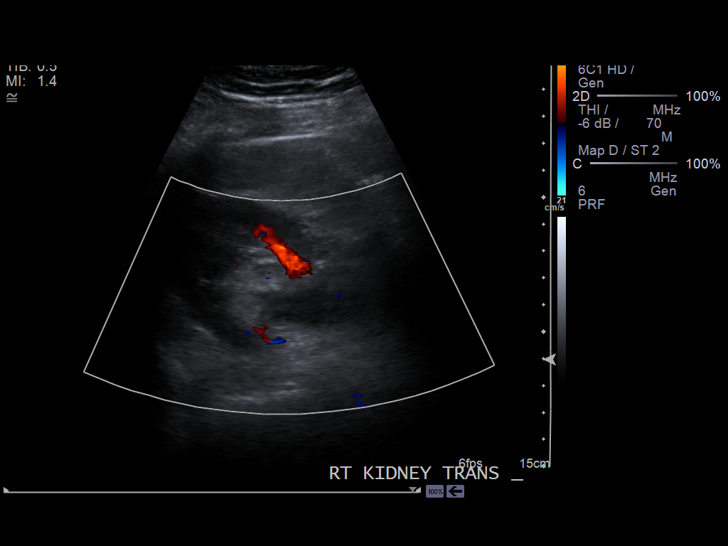

[14 of 25 positions shown; findings below may reference images not displayed]

FINDINGS: Right Kidney:

Length: 12.6 cm. The previously demonstrated marked hydronephrosis
has resolved. There remains mild prominence of the right renal
pelvis. The renal cortical echotexture is normal.

Left Kidney:

Length: 12.7 cm. Echogenicity within normal limits. No mass or
hydronephrosis visualized.

Bladder:

The urinary bladder is partially distended. Bladder jets are
demonstrated bilaterally.
IMPRESSION: The marked hydronephrosis on the right has resolved with only mild
residual prominence of the renal pelvis. There is no left-sided
hydronephrosis. Bilateral ureteral jets are demonstrated.

## 2016-12-03 DIAGNOSIS — G43119 Migraine with aura, intractable, without status migrainosus: Secondary | ICD-10-CM | POA: Insufficient documentation

## 2017-02-15 ENCOUNTER — Encounter: Payer: Self-pay | Admitting: *Deleted

## 2017-02-16 ENCOUNTER — Ambulatory Visit
Admission: RE | Admit: 2017-02-16 | Discharge: 2017-02-16 | Disposition: A | Discharge status: Home / Self Care | Payer: Medicaid Other | Source: Ambulatory Visit | Attending: Gastroenterology | Admitting: Gastroenterology

## 2017-02-16 ENCOUNTER — Encounter
Admission: RE | Disposition: A | Discharge status: Home / Self Care | Payer: Self-pay | Source: Ambulatory Visit | Attending: Gastroenterology

## 2017-02-16 ENCOUNTER — Encounter: Payer: Self-pay | Admitting: *Deleted

## 2017-02-16 ENCOUNTER — Ambulatory Visit: Payer: Medicaid Other | Admitting: Anesthesiology

## 2017-02-16 DIAGNOSIS — Z79899 Other long term (current) drug therapy: Secondary | ICD-10-CM | POA: Insufficient documentation

## 2017-02-16 DIAGNOSIS — Z8371 Family history of colonic polyps: Secondary | ICD-10-CM | POA: Insufficient documentation

## 2017-02-16 DIAGNOSIS — Z885 Allergy status to narcotic agent status: Secondary | ICD-10-CM | POA: Insufficient documentation

## 2017-02-16 DIAGNOSIS — Z800 Family history of malignant neoplasm of digestive organs: Secondary | ICD-10-CM | POA: Insufficient documentation

## 2017-02-16 DIAGNOSIS — K5730 Diverticulosis of large intestine without perforation or abscess without bleeding: Secondary | ICD-10-CM | POA: Insufficient documentation

## 2017-02-16 DIAGNOSIS — Z1211 Encounter for screening for malignant neoplasm of colon: Secondary | ICD-10-CM | POA: Diagnosis present

## 2017-02-16 DIAGNOSIS — E282 Polycystic ovarian syndrome: Secondary | ICD-10-CM | POA: Diagnosis not present

## 2017-02-16 HISTORY — PX: COLONOSCOPY WITH PROPOFOL: SHX5780

## 2017-02-16 HISTORY — DX: Polycystic ovarian syndrome: E28.2

## 2017-02-16 HISTORY — DX: Headache, unspecified: R51.9

## 2017-02-16 HISTORY — DX: Gestational (pregnancy-induced) hypertension without significant proteinuria, unspecified trimester: O13.9

## 2017-02-16 HISTORY — DX: Headache: R51

## 2017-02-16 LAB — POCT PREGNANCY, URINE: Preg Test, Ur: NEGATIVE

## 2017-02-16 SURGERY — COLONOSCOPY WITH PROPOFOL
Anesthesia: General

## 2017-02-16 MED ORDER — PROPOFOL 500 MG/50ML IV EMUL
INTRAVENOUS | Status: AC
  Filled 2017-02-16: qty 50

## 2017-02-16 MED ORDER — SODIUM CHLORIDE 0.9 % IV SOLN
INTRAVENOUS | Status: DC
  Administered 2017-02-16: 07:00:00 via INTRAVENOUS

## 2017-02-16 MED ORDER — MIDAZOLAM HCL 2 MG/2ML IJ SOLN
INTRAMUSCULAR | Status: AC
  Filled 2017-02-16: qty 2

## 2017-02-16 MED ORDER — PHENYLEPHRINE HCL 10 MG/ML IJ SOLN
INTRAMUSCULAR | Status: DC | PRN
  Administered 2017-02-16: 100 ug via INTRAVENOUS

## 2017-02-16 MED ORDER — HYDRALAZINE HCL 20 MG/ML IJ SOLN
INTRAMUSCULAR | Status: AC
Start: 2017-02-16 — End: ?
  Filled 2017-02-16: qty 1

## 2017-02-16 MED ORDER — SODIUM CHLORIDE 0.9 % IV SOLN: INTRAVENOUS | Status: DC

## 2017-02-16 MED ORDER — FENTANYL CITRATE (PF) 100 MCG/2ML IJ SOLN
INTRAMUSCULAR | Status: AC
  Filled 2017-02-16: qty 2

## 2017-02-16 MED ORDER — FENTANYL CITRATE (PF) 100 MCG/2ML IJ SOLN
INTRAMUSCULAR | Status: DC | PRN
  Administered 2017-02-16: 100 ug via INTRAVENOUS

## 2017-02-16 MED ORDER — LIDOCAINE HCL (PF) 2 % IJ SOLN
INTRAMUSCULAR | Status: AC
  Filled 2017-02-16: qty 2

## 2017-02-16 MED ORDER — PROPOFOL 500 MG/50ML IV EMUL
INTRAVENOUS | Status: DC | PRN
  Administered 2017-02-16: 120 ug/kg/min via INTRAVENOUS

## 2017-02-16 MED ORDER — PHENYLEPHRINE HCL 10 MG/ML IJ SOLN
INTRAMUSCULAR | Status: AC
  Filled 2017-02-16: qty 1

## 2017-02-16 MED ORDER — LIDOCAINE HCL (CARDIAC) 20 MG/ML IV SOLN
INTRAVENOUS | Status: DC | PRN
Start: 2017-02-16 — End: 2017-02-16
  Administered 2017-02-16: 2 mL via INTRAVENOUS

## 2017-02-16 MED ORDER — PROPOFOL 10 MG/ML IV BOLUS
INTRAVENOUS | Status: DC | PRN
  Administered 2017-02-16: 30 mg via INTRAVENOUS

## 2017-02-16 MED ORDER — MIDAZOLAM HCL 2 MG/2ML IJ SOLN
INTRAMUSCULAR | Status: DC | PRN
  Administered 2017-02-16: 2 mg via INTRAVENOUS

## 2017-02-16 NOTE — Anesthesia Preprocedure Evaluation (Signed)
Anesthesia Evaluation  Patient identified by MRN, date of birth, ID band Patient awake    Reviewed: Allergy & Precautions, NPO status , Patient's Chart, lab work & pertinent test results  Airway Mallampati: II       Dental  (+) Teeth Intact   Pulmonary neg pulmonary ROS,    breath sounds clear to auscultation       Cardiovascular (-) hypertension Rhythm:Regular     Neuro/Psych    GI/Hepatic negative GI ROS, Neg liver ROS,   Endo/Other  negative endocrine ROS  Renal/GU negative Renal ROS     Musculoskeletal   Abdominal Normal abdominal exam  (+)   Peds negative pediatric ROS (+)  Hematology negative hematology ROS (+)   Anesthesia Other Findings   Reproductive/Obstetrics                             Anesthesia Physical Anesthesia Plan  ASA: I  Anesthesia Plan: General   Post-op Pain Management:    Induction: Intravenous  PONV Risk Score and Plan:   Airway Management Planned: Natural Airway and Nasal Cannula  Additional Equipment:   Intra-op Plan:   Post-operative Plan:   Informed Consent: I have reviewed the patients History and Physical, chart, labs and discussed the procedure including the risks, benefits and alternatives for the proposed anesthesia with the patient or authorized representative who has indicated his/her understanding and acceptance.     Plan Discussed with: CRNA  Anesthesia Plan Comments:         Anesthesia Quick Evaluation

## 2017-02-16 NOTE — H&P (Addendum)
Outpatient short stay form Pre-procedure 02/16/2017 7:30 AM Rita DeemMartin U Doyne Ellinger MD  Primary Physician: Doristine MangoElizabeth White NP  Reason for visit:  Colonoscopy  History of present illness:  [name] presenting today as above. She has a family history of colon cancer in maternal grandmother and polyps in her mother. She tolerated her prep for colonoscopy well. She takes no aspirin or blood thinning agents.    Current Facility-Administered Medications:  .  0.9 %  sodium chloride infusion, , Intravenous, Continuous, Rita DeemSkulskie, Sherrica Niehaus U, MD, Last Rate: 20 mL/hr at 02/16/17 0715 .  0.9 %  sodium chloride infusion, , Intravenous, Continuous, Rita DeemSkulskie, Alegria Dominique U, MD  Prescriptions Prior to Admission  Medication Sig Dispense Refill Last Dose  . diclofenac (VOLTAREN) 75 MG EC tablet Take 75 mg by mouth 2 (two) times daily.   02/15/2017 at Unknown time  . escitalopram (LEXAPRO) 20 MG tablet Take 20 mg by mouth daily.   02/15/2017 at Unknown time  . gabapentin (NEURONTIN) 300 MG capsule Take 300 mg by mouth 3 (three) times daily.   02/15/2017 at Unknown time  . levonorgestrel (MIRENA) 20 MCG/24HR IUD 1 each by Intrauterine route once.     . Multiple Vitamin (MULTIVITAMIN) LIQD Take 5 mLs by mouth daily.   Past Week at Unknown time  . nortriptyline (PAMELOR) 10 MG/5ML solution Take by mouth at bedtime.   02/15/2017 at Unknown time  . topiramate (TOPAMAX) 100 MG tablet Take 100 mg by mouth 2 (two) times daily.   02/15/2017 at Unknown time     Allergies  Allergen Reactions  . Codeine Nausea Only     Past Medical History:  Diagnosis Date  . Headache   . PCOS (polycystic ovarian syndrome)   . PIH (pregnancy induced hypertension)     Review of systems:      Physical Exam    Heart and lungs: Regular rate and rhythm without rub or gallop, lungs are bilaterally clear.    HEENT: Normocephalic atraumatic eyes are anicteric    Other:     Pertinant exam for procedure: Soft nontender  nondistended bowel sounds positive normoactive.    Planned proceedures: Colonoscopy and indicated procedures.  I have discussed the risks benefits and complications of procedures to include not limited to bleeding, infection, perforation and the risk of sedation and the patient wishes to proceed.   Rita DeemMartin U Stasia Somero, MD Gastroenterology 02/16/2017  7:30 AM

## 2017-02-16 NOTE — Anesthesia Post-op Follow-up Note (Signed)
Anesthesia QCDR form completed.        

## 2017-02-16 NOTE — Transfer of Care (Signed)
Immediate Anesthesia Transfer of Care Note  Patient: Rita White  Procedure(s) Performed: Procedure(s): COLONOSCOPY WITH PROPOFOL (N/A)  Patient Location: PACU  Anesthesia Type:General  Level of Consciousness: awake  Airway & Oxygen Therapy: Patient Spontanous Breathing and Patient connected to nasal cannula oxygen  Post-op Assessment: Report given to RN  Post vital signs: Reviewed  Last Vitals:  Vitals:   02/16/17 0651  BP: 109/74  Pulse: (!) 58  Resp: 16  Temp: (!) 35.6 C    Last Pain:  Vitals:   02/16/17 0651  TempSrc: Tympanic         Complications: No apparent anesthesia complications

## 2017-02-16 NOTE — Op Note (Signed)
[facility]astroenter[facility] Patient Name: Rita White Procedure Date: 02/16/2017 7:32 AM MRN: [id number] Account #: [id number] Date of Birth: 1975/10/08 Admit Type: Outpatient Age: 41 Room: [facility] ENDO ROOM 3 Gender: Female Note Status: Finalized Procedure:            Colonoscopy Indications:          Screening for colon cancer: Family history of                        colorectal cancer in distant relative(s), Colon cancer                        screening in patient at increased risk: Family history                        of 1st-degree relative with colon polyps Providers:            Christena Deem, MD Referring MD:         Courtney Paris. Cliffton Asters, MD (Referring MD) Medicines:            Monitored Anesthesia Care Complications:        No immediate complications. Procedure:            Pre-Anesthesia Assessment:                       - ASA Grade Assessment: I - A normal, healthy patient.                       After obtaining informed consent, the colonoscope was                        passed under direct vision. Throughout the procedure,                        the patient's blood pressure, pulse, and oxygen                        saturations were monitored continuously. The                        Colonoscope was introduced through the anus and                        advanced to the the cecum, identified by appendiceal                        orifice and ileocecal valve. The quality of the bowel                        preparation was good. The colonoscopy was performed                        without difficulty. The patient tolerated the procedure                        well. Findings:      A few medium-mouthed diverticula were found in the descending colon and       splenic flexure.      The retroflexed view of the distal rectum and anal verge was normal and  showed no anal or rectal abnormalities.      The exam was otherwise without abnormality. Impression:            - Diverticulosis in the descending colon and at the                        splenic flexure.                       - The distal rectum and anal verge are normal on                        retroflexion view.                       - The examination was otherwise normal.                       - No specimens collected. Recommendation:       - Repeat colonoscopy in 5 years for screening purposes. Procedure Code(s):    --- Professional ---                       [phone], Colonoscopy, flexible; diagnostic, including                        collection of specimen(s) by brushing or washing, when                        performed (separate procedure) Diagnosis Code(s):    --- Professional ---                       Z12.11, Encounter for screening for malignant neoplasm                        of colon                       Z80.0, Family history of malignant neoplasm of                        digestive organs                       Z83.71, Family history of colonic polyps                       K57.30, Diverticulosis of large intestine without                        perforation or abscess without bleeding CPT copyright 2016 [employer]. All rights reserved. The codes documented in this report are preliminary and upon coder review may  be revised to meet current compliance requirements. Christena DeemMartin U Skulskie, MD 02/16/2017 8:08:26 AM This report has been signed electronically. Number of Addenda: 0 Note Initiated On: 02/16/2017 7:32 AM Scope Withdrawal Time: 0 hours 9 minutes 3 seconds  Total Procedure Duration: 0 hours 21 minutes 49 seconds       [facility] [place] [facility]

## 2017-02-16 NOTE — Anesthesia Postprocedure Evaluation (Signed)
Anesthesia Post Note  Patient: Rita White  Procedure(s) Performed: Procedure(s) (LRB): COLONOSCOPY WITH PROPOFOL (N/A)  Patient location during evaluation: PACU Anesthesia Type: General Level of consciousness: awake Pain management: pain level controlled Vital Signs Assessment: post-procedure vital signs reviewed and stable Respiratory status: nonlabored ventilation Cardiovascular status: stable Anesthetic complications: no     Last Vitals:  Vitals:   02/16/17 0821 02/16/17 0831  BP: 102/74 108/73  Pulse: 68 (!) 58  Resp: (!) 22 18  Temp:      Last Pain:  Vitals:   02/16/17 0811  TempSrc: Tympanic                 VAN STAVEREN,Naoko Diperna

## 2017-02-17 ENCOUNTER — Encounter: Payer: Self-pay | Admitting: Gastroenterology

## 2018-07-04 ENCOUNTER — Other Ambulatory Visit: Payer: Self-pay | Admitting: Orthopedic Surgery

## 2018-07-04 DIAGNOSIS — M5417 Radiculopathy, lumbosacral region: Secondary | ICD-10-CM

## 2018-07-04 DIAGNOSIS — G8929 Other chronic pain: Secondary | ICD-10-CM

## 2018-07-04 DIAGNOSIS — M5442 Lumbago with sciatica, left side: Principal | ICD-10-CM

## 2018-07-04 DIAGNOSIS — M545 Low back pain, unspecified: Secondary | ICD-10-CM

## 2018-07-04 DIAGNOSIS — M5440 Lumbago with sciatica, unspecified side: Secondary | ICD-10-CM

## 2018-07-22 ENCOUNTER — Ambulatory Visit
Admission: RE | Admit: 2018-07-22 | Discharge: 2018-07-22 | Disposition: A | Discharge status: Home / Self Care | Payer: Medicaid Other | Source: Ambulatory Visit | Attending: Orthopedic Surgery | Admitting: Orthopedic Surgery

## 2018-07-22 DIAGNOSIS — M545 Low back pain, unspecified: Secondary | ICD-10-CM

## 2018-07-22 DIAGNOSIS — M5442 Lumbago with sciatica, left side: Secondary | ICD-10-CM | POA: Insufficient documentation

## 2018-07-22 DIAGNOSIS — M5417 Radiculopathy, lumbosacral region: Secondary | ICD-10-CM | POA: Insufficient documentation

## 2018-07-22 DIAGNOSIS — M5440 Lumbago with sciatica, unspecified side: Secondary | ICD-10-CM | POA: Diagnosis present

## 2018-07-22 DIAGNOSIS — G8929 Other chronic pain: Secondary | ICD-10-CM | POA: Insufficient documentation

## 2018-08-03 DIAGNOSIS — F419 Anxiety disorder, unspecified: Secondary | ICD-10-CM | POA: Insufficient documentation

## 2019-03-10 ENCOUNTER — Other Ambulatory Visit: Payer: Self-pay | Admitting: Orthopedic Surgery

## 2019-03-10 DIAGNOSIS — M5417 Radiculopathy, lumbosacral region: Secondary | ICD-10-CM

## 2019-03-10 DIAGNOSIS — M545 Low back pain, unspecified: Secondary | ICD-10-CM

## 2019-03-10 DIAGNOSIS — G8929 Other chronic pain: Secondary | ICD-10-CM

## 2019-04-01 ENCOUNTER — Other Ambulatory Visit: Payer: Self-pay

## 2019-04-01 ENCOUNTER — Ambulatory Visit
Admission: RE | Admit: 2019-04-01 | Discharge: 2019-04-01 | Disposition: A | Discharge status: Home / Self Care | Payer: Medicaid Other | Source: Ambulatory Visit | Attending: Orthopedic Surgery | Admitting: Orthopedic Surgery

## 2019-04-01 DIAGNOSIS — M5440 Lumbago with sciatica, unspecified side: Secondary | ICD-10-CM | POA: Diagnosis present

## 2019-04-01 DIAGNOSIS — M5417 Radiculopathy, lumbosacral region: Secondary | ICD-10-CM | POA: Insufficient documentation

## 2019-04-01 DIAGNOSIS — M5442 Lumbago with sciatica, left side: Secondary | ICD-10-CM | POA: Diagnosis present

## 2019-04-01 DIAGNOSIS — G8929 Other chronic pain: Secondary | ICD-10-CM | POA: Diagnosis present

## 2019-04-01 DIAGNOSIS — M545 Low back pain, unspecified: Secondary | ICD-10-CM

## 2019-08-30 ENCOUNTER — Other Ambulatory Visit: Payer: Self-pay | Admitting: Neurosurgery

## 2019-08-30 DIAGNOSIS — M79605 Pain in left leg: Secondary | ICD-10-CM

## 2019-09-01 ENCOUNTER — Other Ambulatory Visit: Payer: Self-pay | Admitting: Neurosurgery

## 2019-09-01 DIAGNOSIS — M79605 Pain in left leg: Secondary | ICD-10-CM

## 2019-09-01 DIAGNOSIS — G541 Lumbosacral plexus disorders: Secondary | ICD-10-CM

## 2019-09-12 ENCOUNTER — Ambulatory Visit
Admission: RE | Admit: 2019-09-12 | Discharge: 2019-09-12 | Disposition: A | Discharge status: Home / Self Care | Payer: Medicaid Other | Source: Ambulatory Visit | Attending: Neurosurgery | Admitting: Neurosurgery

## 2019-09-12 ENCOUNTER — Other Ambulatory Visit: Payer: Self-pay

## 2019-09-12 DIAGNOSIS — G541 Lumbosacral plexus disorders: Secondary | ICD-10-CM | POA: Insufficient documentation

## 2019-09-12 DIAGNOSIS — M79605 Pain in left leg: Secondary | ICD-10-CM | POA: Diagnosis present

## 2019-10-26 ENCOUNTER — Other Ambulatory Visit: Payer: Self-pay | Admitting: Physician Assistant

## 2019-10-26 DIAGNOSIS — Z1231 Encounter for screening mammogram for malignant neoplasm of breast: Secondary | ICD-10-CM

## 2020-03-05 ENCOUNTER — Telehealth: Payer: Self-pay

## 2020-03-05 NOTE — Telephone Encounter (Signed)
Called patient to confirm appt. Instructed her to fill out her H&P and to bring in a list of her medications. Patient with understanding.

## 2020-03-06 ENCOUNTER — Ambulatory Visit
Admission: RE | Admit: 2020-03-06 | Discharge: 2020-03-06 | Disposition: A | Discharge status: Home / Self Care | Payer: Medicaid Other | Source: Ambulatory Visit | Attending: Pain Medicine | Admitting: Pain Medicine

## 2020-03-06 ENCOUNTER — Encounter: Payer: Self-pay | Admitting: Pain Medicine

## 2020-03-06 ENCOUNTER — Other Ambulatory Visit: Payer: Self-pay

## 2020-03-06 ENCOUNTER — Ambulatory Visit: Payer: Medicaid Other | Attending: Pain Medicine | Admitting: Pain Medicine

## 2020-03-06 VITALS — BP 112/80 | HR 77 | Temp 97.7°F | Resp 16 | Ht 69.0 in | Wt 200.0 lb

## 2020-03-06 DIAGNOSIS — M47816 Spondylosis without myelopathy or radiculopathy, lumbar region: Secondary | ICD-10-CM

## 2020-03-06 DIAGNOSIS — Z789 Other specified health status: Secondary | ICD-10-CM | POA: Diagnosis present

## 2020-03-06 DIAGNOSIS — M5417 Radiculopathy, lumbosacral region: Secondary | ICD-10-CM

## 2020-03-06 DIAGNOSIS — M79605 Pain in left leg: Secondary | ICD-10-CM | POA: Diagnosis present

## 2020-03-06 DIAGNOSIS — M899 Disorder of bone, unspecified: Secondary | ICD-10-CM | POA: Insufficient documentation

## 2020-03-06 DIAGNOSIS — Z79899 Other long term (current) drug therapy: Secondary | ICD-10-CM | POA: Diagnosis present

## 2020-03-06 DIAGNOSIS — G8929 Other chronic pain: Secondary | ICD-10-CM | POA: Insufficient documentation

## 2020-03-06 DIAGNOSIS — N319 Neuromuscular dysfunction of bladder, unspecified: Secondary | ICD-10-CM | POA: Insufficient documentation

## 2020-03-06 DIAGNOSIS — M5442 Lumbago with sciatica, left side: Secondary | ICD-10-CM | POA: Insufficient documentation

## 2020-03-06 DIAGNOSIS — G894 Chronic pain syndrome: Secondary | ICD-10-CM | POA: Insufficient documentation

## 2020-03-06 MED ORDER — PREDNISONE 20 MG PO TABS: ORAL_TABLET | ORAL | 0 refills | Status: AC

## 2020-03-06 NOTE — Progress Notes (Signed)
Patient: Rita White  Service Category: E/M  Provider: Gaspar Cola, MD  DOB: 05/15/1976  DOS: 03/06/2020  Referring Provider: Harvest Dark, FNP  MRN: [id number]  Setting: Ambulatory outpatient  PCP: Ulyess Blossom, PA  Type: New Patient  Specialty: Interventional Pain Management    Location: Office  Delivery: Face-to-face     Primary Reason(s) for Visit: Encounter for initial evaluation of one or more chronic problems (new to examiner) potentially causing chronic pain, and posing a threat to normal musculoskeletal function. (Level of risk: High) CC: Back Pain (lower)  HPI  Rita White is a 44 y.o. year old, female patient, who comes for the first time to our practice referred by [place], Sherren Kerns, [place],  [place] [id number], for our initial evaluation of her chronic pain. She has Anxiety; Intractable migraine with aura without status migrainosus; Migraine without aura and without status migrainosus, not intractable; Overweight; PCOS (polycystic ovarian syndrome); Chronic low back pain (1ry area of Pain) (Bilateral) (L>R) w/ sciatica (Left); Chronic lower extremity pain (2ry area of Pain) (Left); Neurogenic bladder; Lumbosacral radiculopathy at S1 (Left); Chronic pain syndrome; Pharmacologic therapy; Disorder of skeletal system; Problems influencing health status; and Lumbar facet syndrome (Bilateral) (L>R) on their problem list. Today she comes in for evaluation of her Back Pain (lower)  Pain Assessment: Location: Lower Back Radiating: radiates down side of left leg into 2nd end toes Onset: More than a month ago Duration: Chronic pain Quality: Aching, Sharp, Stabbing, Shooting, Dull (hot poker) Severity: 4 /10 (subjective, self-reported pain score)  Effect on ADL: limits activities Timing: Constant Modifying factors: heat, lying down BP: 112/80  HR: 77  Onset and Duration: Gradual and Present longer than 3 months Cause of pain: Unknown Severity:  Getting better, NAS-11 at its worse: 10/10, NAS-11 at its best: 5/10, NAS-11 now: 6/10 and NAS-11 on the average: 4/10 Timing: Not influenced by the time of the day Aggravating Factors: Bending, Lifiting, Motion, Prolonged sitting, Prolonged standing and Twisting Alleviating Factors: Cold packs, Hot packs, Lying down, Medications, Nerve blocks, Resting, Sleeping, TENS and Warm showers or baths Associated Problems: Constipation, Day-time cramps, Night-time cramps, Dizziness, Fatigue, Inability to control bladder (urine), Nausea, Sweating, Tingling, Weakness and Pain that wakes patient up Quality of Pain: Aching, Burning, Constant, Dull, Exhausting, Hot, Itching, Sharp and Tingling Previous Examinations or Tests: CT scan and MRI scan Previous Treatments: Epidural steroid injections, Facet blocks, Physical Therapy, Steroid treatments by mouth and TENS  According to the patient the primary area of pain is that of the lower back, bilaterally, (L>R).  The patient indicates that the predominant aspect of the pain is that in the midline and then it spreads laterally.  She denies any surgeries, but she does admit to an MRI done in 2021 at [facility].  She also indicates having had physical therapy approximately a year ago for her.  Of 1 month where she went to PT twice a week at the [facility].  The patient also indicates having had some injections done by Dr. Sharlet Salina (x4).  She indicates having had 3 lumbar epidural steroid injections from November 2020-21 in addition she indicates having had a single lumbar facet block that provided her with the best relief of her low back pain and it also did provide her with some lower extremity relief.  The patient describes having urinary incontinence which started approximately 8 to 9 months ago and has been worsening.  The patient indicates having informed Dr. Sharlet Salina  about this problem on her second appointment but she has not seen a neurosurgeon.  The patient's secondary  area pain is that of the left lower extremity with the pain going down to toes #4 and 5 (little toe on the next 1 to it).  She denies any x-rays, nerve blocks, physical therapy, or having had IV lower extremity surgery.  She describes this pain to run on the lateral aspect of the leg to the bottom of the foot where it feels numb.  This appears to follow an S1 dermatomal distribution.  In addition to this, the patient feels that she has some weakness in the left leg, especially when she is trying to stand up.  She describes this as if there was no leg.  The patient indicates not having had any oral steroids tried.  In the case of the right lower extremity, she describes weakness in the area of the right knee, but no other apparent symptoms.  She also describes pain in the left groin area and this pain in the lower back seems to be affecting her bladder and creating this incontinence.  Interestingly, she describes that they left lower extremity pain started first approximately 1 year ago and it has been getting worse.  At this point, she describes that her low back pain is worse than the left lower extremity pain.  The patient describes having tried some hydrocodone and tramadol for the pain.  Today I took the time to provide the patient with information regarding my pain practice. The patient was informed that my practice is divided into two sections: an interventional pain management section, as well as a completely separate and distinct medication management section. I explained that I have procedure days for my interventional therapies, and evaluation days for follow-ups and medication management. Because of the amount of documentation required during both, they are kept separated. This means that there is the possibility that she may be scheduled for a procedure on one day, and medication management the next. I have also informed her that because of staffing and facility limitations, I no longer take patients  for medication management only. To illustrate the reasons for this, I gave the patient the example of surgeons, and how inappropriate it would be to refer a patient to his/her care, just to write for the post-surgical antibiotics on a surgery done by a different surgeon.   Because interventional pain management is my board-certified specialty, the patient was informed that joining my practice means that they are open to any and all interventional therapies. I made it clear that this does not mean that they will be forced to have any procedures done. What this means is that I believe interventional therapies to be essential part of the diagnosis and proper management of chronic pain conditions. Therefore, patients not interested in these interventional alternatives will be better served under the care of a different practitioner.  The patient was also made aware of my Comprehensive Pain Management Safety Guidelines where by joining my practice, they limit all of their nerve blocks and joint injections to those done by our practice, for as long as we are retained to manage their care.   Historic Controlled Substance Pharmacotherapy Review  PMP and historical list of controlled substances: Diazepam 5 mg; tramadol 50 mg; hydrocodone/APAP 5/325. Current opioid analgesics:  None MME/day: 0 mg/day  Historical Monitoring: The patient  reports no history of drug use. List of all UDS Test(s): No results found for: MDMA, COCAINSCRNUR, [place],  PCPQUANT, CANNABQUANT, THCU, [place] List of other Serum/Urine Drug Screening Test(s):  No results found for: AMPHSCRSER, BARBSCRSER, BENZOSCRSER, COCAINSCRSER, COCAINSCRNUR, PCPSCRSER, PCPQUANT, THCSCRSER, THCU, CANNABQUANT, OPIATESCRSER, OXYSCRSER, PROPOXSCRSER, ETH Historical Background Evaluation: [place] PMP: PDMP reviewed during this encounter. Online review of the past 37-monthperiod conducted.             PMP NARX Score Report:  Narcotic: 160 Sedative:  090 Stimulant: 000 [place] Department of public safety, offender search: (Editor, commissioningInformation) Non-contributory Risk Assessment Profile: Aberrant behavior: None observed or detected today Risk factors for fatal opioid overdose: None identified today PMP NARX Overdose Risk Score: 230 Fatal overdose hazard ratio (HR): Calculation deferred Non-fatal overdose hazard ratio (HR): Calculation deferred Risk of opioid abuse or dependence: 0.7-3.0% with doses ? 36 MME/day and 6.1-26% with doses ? 120 MME/day. Substance use disorder (SUD) risk level: See below Personal History of Substance Abuse (SUD-Substance use disorder):  Alcohol: Negative  Illegal Drugs: Negative  Rx Drugs: Negative  ORT Risk Level calculation: Low Risk  Opioid Risk Tool - 03/06/20 1147      Family History of Substance Abuse   Alcohol Negative    Illegal Drugs Negative    Rx Drugs Negative      Personal History of Substance Abuse   Alcohol Negative    Illegal Drugs Negative    Rx Drugs Negative      Age   Age between 136-45years  No      History of Preadolescent Sexual Abuse   History of Preadolescent Sexual Abuse Negative or Female      Psychological Disease   Psychological Disease Negative    Depression Negative      Total Score   Opioid Risk Tool Scoring 0    Opioid Risk Interpretation Low Risk          ORT Scoring interpretation table:  Score <3 = Low Risk for SUD  Score between 4-7 = Moderate Risk for SUD  Score >8 = High Risk for Opioid Abuse   PHQ-2 Depression Scale:  Total score:    PHQ-2 Scoring interpretation table: (Score and probability of major depressive disorder)  Score 0 = No depression  Score 1 = 15.4% Probability  Score 2 = 21.1% Probability  Score 3 = 38.4% Probability  Score 4 = 45.5% Probability  Score 5 = 56.4% Probability  Score 6 = 78.6% Probability   PHQ-9 Depression Scale:  Total score:    PHQ-9 Scoring interpretation table:  Score 0-4 = No depression  Score 5-9 = Mild  depression  Score 10-14 = Moderate depression  Score 15-19 = Moderately severe depression  Score 20-27 = Severe depression (2.4 times higher risk of SUD and 2.89 times higher risk of overuse)   Pharmacologic Plan: As per protocol, I have not taken over any controlled substance management, pending the results of ordered tests and/or consults.            Initial impression: Pending review of available data and ordered tests.  Meds   Current Outpatient Medications:  .  escitalopram (LEXAPRO) 20 MG tablet, Take 20 mg by mouth daily., Disp: , Rfl:  .  famotidine (PEPCID) 20 MG tablet, Take 20 mg by mouth daily., Disp: , Rfl:  .  gabapentin (NEURONTIN) 300 MG capsule, Take 300 mg by mouth at bedtime. , Disp: , Rfl:  .  HYDROcodone-acetaminophen (NORCO/VICODIN) 5-325 MG tablet, Take 1 tablet by mouth every 6 (six) hours as needed for moderate pain., Disp: , Rfl:  .  levonorgestrel (MIRENA) 20 MCG/24HR IUD, 1 each by Intrauterine route once., Disp: , Rfl:  .  linaclotide (LINZESS) 145 MCG CAPS capsule, Take 145 mcg by mouth daily before breakfast., Disp: , Rfl:  .  Multiple Vitamin (MULTIVITAMIN) LIQD, Take 5 mLs by mouth daily., Disp: , Rfl:  .  ondansetron (ZOFRAN) 4 MG tablet, Take 4 mg by mouth every 8 (eight) hours as needed for nausea or vomiting., Disp: , Rfl:  .  promethazine (PHENERGAN) 25 MG tablet, Take 25 mg by mouth every 8 (eight) hours as needed for nausea or vomiting., Disp: , Rfl:  .  SUMAtriptan (IMITREX) 50 MG tablet, Take 50 mg by mouth every 2 (two) hours as needed for migraine. May repeat in 2 hours if headache persists or recurs., Disp: , Rfl:  .  tiZANidine (ZANAFLEX) 4 MG tablet, Take 4 mg by mouth at bedtime., Disp: , Rfl:  .  topiramate (TOPAMAX) 100 MG tablet, Take 100 mg by mouth 2 (two) times daily., Disp: , Rfl:  .  traMADol (ULTRAM) 50 MG tablet, Take by mouth every 6 (six) hours as needed., Disp: , Rfl:   Imaging Review  Lumbosacral Imaging: Lumbar MR wo contrast:  Results for orders placed during the hospital encounter of 04/01/19 MR LUMBAR SPINE WO CONTRAST  Narrative CLINICAL DATA:  Chronic left-sided lower back pain  EXAM: MRI LUMBAR SPINE WITHOUT CONTRAST  TECHNIQUE: Multiplanar, multisequence MR imaging of the lumbar spine was performed. No intravenous contrast was administered.  COMPARISON:  July 22, 2018  FINDINGS: Segmentation: There are 5 non-rib bearing lumbar type vertebral bodies with the last intervertebral disc space labeled as L5-S1.  Alignment:  Normal  Vertebrae: The vertebral body heights are well maintained. No fracture, marrow edema,or pathologic marrow infiltration. 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  Conus medullaris and cauda equina: Conus extends to the L1 level. Conus and cauda equina appear normal.  Paraspinal and other soft tissues: The paraspinal soft tissues and visualized retroperitoneal structures are unremarkable. The sacroiliac joints are intact.  Disc levels:  T12-L1:  No significant canal or neural foraminal narrowing.  L1-L2: There is a minimal broad-based disc bulge, however no significant canal or neural foraminal narrowing.  L2-L3:   No significant canal or neural foraminal narrowing.  L3-L4: There is a minimal broad-based disc bulge, however no significant canal or neural foraminal narrowing.  L4-L5: There is a broad-based disc bulge with a left far lateral annular fissure. There is no significant canal or neural foraminal narrowing.  L5-S1: There is a broad-based disc bulge with facet arthrosis which causes mild left neural foraminal narrowing. There is also a left far lateral annular fissure.  IMPRESSION: Mild lumbar spine spondylosis most notable at L4-L5 and L5-S1 with left far lateral annular fissures. Mild left neural foraminal narrowing is seen at L5-S1. No  significant canal stenosis. This is not significantly changed since the prior exam.   Electronically Signed By: Prudencio Pair M.D. On: 04/01/2019 18:20  Complexity Note: Imaging results reviewed. Results shared with Rita White, using [employer].                        ROS  Cardiovascular: No reported cardiovascular signs or symptoms such as High blood pressure, coronary artery disease, abnormal heart rate or rhythm, heart attack, blood thinner therapy or heart weakness and/or failure Pulmonary or Respiratory: No  reported pulmonary signs or symptoms such as wheezing and difficulty taking a deep full breath (Asthma), difficulty blowing air out (Emphysema), coughing up mucus (Bronchitis), persistent dry cough, or temporary stoppage of breathing during sleep Neurological: No reported neurological signs or symptoms such as seizures, abnormal skin sensations, urinary and/or fecal incontinence, being born with an abnormal open spine and/or a tethered spinal cord Psychological-Psychiatric: Anxiousness Gastrointestinal: Reflux or heatburn, Alternating episodes iof diarrhea and constipation (IBS-Irritable bowe syndrome) and Irregular, infrequent bowel movements (Constipation) Genitourinary: No reported renal or genitourinary signs or symptoms such as difficulty voiding or producing urine, peeing blood, non-functioning kidney, kidney stones, difficulty emptying the bladder, difficulty controlling the flow of urine, or chronic kidney disease Hematological: No reported hematological signs or symptoms such as prolonged bleeding, low or poor functioning platelets, bruising or bleeding easily, hereditary bleeding problems, low energy levels due to low hemoglobin or being anemic Endocrine: No reported endocrine signs or symptoms such as high or low blood sugar, rapid heart rate due to high thyroid levels, obesity or weight gain due to slow thyroid or thyroid disease Rheumatologic: No reported rheumatological  signs and symptoms such as fatigue, joint pain, tenderness, swelling, redness, heat, stiffness, decreased range of motion, with or without associated rash Musculoskeletal: Negative for myasthenia gravis, muscular dystrophy, multiple sclerosis or malignant hyperthermia Work History: Quit going to work on his/her own  Allergies  Rita White is allergic to codeine.  Laboratory Chemistry Profile   Renal Lab Results  Component Value Date   BUN 14 03/06/2020   CREATININE 0.73 03/06/2020   BCR 19 03/06/2020   GFRAA 115 03/06/2020   GFRNONAA 100 03/06/2020   PROTEINUR see comment 10/24/2014     Electrolytes Lab Results  Component Value Date   NA 139 03/06/2020   K 4.2 03/06/2020   CL 105 03/06/2020   CALCIUM 9.5 03/06/2020   MG 2.2 03/06/2020     Hepatic Lab Results  Component Value Date   AST 16 03/06/2020   ALT 46 08/12/2014   ALBUMIN 4.5 03/06/2020   ALKPHOS 78 03/06/2020     ID Lab Results  Component Value Date   PREGTESTUR NEGATIVE 02/16/2017     Bone Lab Results  Component Value Date   25OHVITD1 32 03/06/2020   25OHVITD2 <1.0 03/06/2020   25OHVITD3 32 03/06/2020     Endocrine Lab Results  Component Value Date   GLUCOSE 80 03/06/2020   GLUCOSEU see comment 10/24/2014     Neuropathy Lab Results  Component Value Date   MMCRFVOH60 677 03/06/2020     CNS No results found for: COLORCSF, APPEARCSF, RBCCOUNTCSF, WBCCSF, POLYSCSF, LYMPHSCSF, EOSCSF, PROTEINCSF, GLUCCSF, JCVIRUS, CSFOLI, IGGCSF, LABACHR, ACETBL, LABACHR, ACETBL   Inflammation (CRP: Acute  ESR: Chronic) Lab Results  Component Value Date   CRP 2 03/06/2020   ESRSEDRATE 17 03/06/2020     Rheumatology No results found for: RF, ANA, LABURIC, URICUR, LYMEIGGIGMAB, LYMEABIGMQN, HLAB27   Coagulation Lab Results  Component Value Date   PLT 205 10/25/2014     Cardiovascular Lab Results  Component Value Date   HGB 11.5 (L) 10/25/2014   HCT 35.1 10/25/2014     Screening Lab Results   Component Value Date   PREGTESTUR NEGATIVE 02/16/2017     Cancer No results found for: CEA, CA125, LABCA2   Allergens No results found for: ALMOND, APPLE, ASPARAGUS, AVOCADO, BANANA, BARLEY, BASIL, BAYLEAF, GREENBEAN, LIMABEAN, WHITEBEAN, BEEFIGE, REDBEET, BLUEBERRY, BROCCOLI, CABBAGE, MELON, CARROT, CASEIN, CASHEWNUT, CAULIFLOWER, CELERY     Note: Lab  results reviewed.  [place]  Drug: Rita White  reports no history of drug use. Alcohol:  reports no history of alcohol use. Tobacco:  reports that she has never smoked. She has never used smokeless tobacco. Medical:  has a past medical history of Headache, PCOS (polycystic ovarian syndrome), and PIH (pregnancy induced hypertension). Family: family history is not on file.  Past Surgical History:  Procedure Laterality Date  . ARTHROSCOPIC REPAIR ACL Left   . COLONOSCOPY WITH PROPOFOL N/A 02/16/2017   Procedure: COLONOSCOPY WITH PROPOFOL;  Surgeon: Lollie Sails, MD;  Location: [facility] ENDOSCOPY;  Service: Endoscopy;  Laterality: N/A;  . stent in kidney Left    Active Ambulatory Problems    Diagnosis Date Noted  . Anxiety 08/03/2018  . Intractable migraine with aura without status migrainosus 12/03/2016  . Migraine without aura and without status migrainosus, not intractable 03/23/2011  . Overweight 11/07/2013  . PCOS (polycystic ovarian syndrome) 03/23/2011  . Chronic low back pain (1ry area of Pain) (Bilateral) (L>R) w/ sciatica (Left) 03/06/2020  . Chronic lower extremity pain (2ry area of Pain) (Left) 03/06/2020  . Neurogenic bladder 03/06/2020  . Lumbosacral radiculopathy at S1 (Left) 03/06/2020  . Chronic pain syndrome 03/06/2020  . Pharmacologic therapy 03/06/2020  . Disorder of skeletal system 03/06/2020  . Problems influencing health status 03/06/2020  . Lumbar facet syndrome (Bilateral) (L>R) 03/06/2020   Resolved Ambulatory Problems    Diagnosis Date Noted  . No Resolved Ambulatory Problems   Past Medical History:   Diagnosis Date  . Headache   . PIH (pregnancy induced hypertension)    Constitutional Exam  General appearance: Well nourished, well developed, and well hydrated. In no apparent acute distress Vitals:   03/06/20 1135  BP: 112/80  Pulse: 77  Resp: 16  Temp: 97.7 F (36.5 C)  TempSrc: Temporal  SpO2: 100%  Weight: 200 lb (90.7 kg)  Height: 5' 9" (1.753 m)   BMI Assessment: Estimated body mass index is 29.53 kg/m as calculated from the following:   Height as of this encounter: 5' 9" (1.753 m).   Weight as of this encounter: 200 lb (90.7 kg).  BMI interpretation table: BMI level Category Range association with higher incidence of chronic pain  <18 kg/m2 Underweight   18.5-24.9 kg/m2 Ideal body weight   25-29.9 kg/m2 Overweight Increased incidence by 20%  30-34.9 kg/m2 Obese (Class I) Increased incidence by 68%  35-39.9 kg/m2 Severe obesity (Class II) Increased incidence by 136%  >40 kg/m2 Extreme obesity (Class III) Increased incidence by 254%   Patient's current BMI Ideal Body weight  Body mass index is 29.53 kg/m. Ideal body weight: 66.2 kg (145 lb 15.1 oz) Adjusted ideal body weight: 76 kg (167 lb 9.1 oz)   BMI Readings from Last 4 Encounters:  03/06/20 29.53 kg/m  02/16/17 26.58 kg/m   Wt Readings from Last 4 Encounters:  03/06/20 200 lb (90.7 kg)  02/16/17 180 lb (81.6 kg)    Psych/Mental status: Alert, oriented x 3 (person, place, & time)       Eyes: PERLA Respiratory: No evidence of acute respiratory distress  Cervical Spine Exam  Skin & Axial Inspection: No masses, redness, edema, swelling, or associated skin lesions Alignment: Symmetrical Functional ROM: Unrestricted ROM      Stability: No instability detected Muscle Tone/Strength: Functionally intact. No obvious neuro-muscular anomalies detected. Sensory (Neurological): Unimpaired Palpation: No palpable anomalies              Upper Extremity (UE) Exam  Side: Right upper extremity  Side: Left  upper extremity  Skin & Extremity Inspection: Skin color, temperature, and hair growth are WNL. No peripheral edema or cyanosis. No masses, redness, swelling, asymmetry, or associated skin lesions. No contractures.  Skin & Extremity Inspection: Skin color, temperature, and hair growth are WNL. No peripheral edema or cyanosis. No masses, redness, swelling, asymmetry, or associated skin lesions. No contractures.  Functional ROM: Unrestricted ROM          Functional ROM: Unrestricted ROM          Muscle Tone/Strength: Functionally intact. No obvious neuro-muscular anomalies detected.   Muscle Tone/Strength: Functionally intact. No obvious neuro-muscular anomalies detected.  Sensory (Neurological): Unimpaired          Sensory (Neurological): Unimpaired          Palpation: No palpable anomalies              Palpation: No palpable anomalies              Provocative Test(s):  Phalen's test: deferred Tinel's test: deferred Apley's scratch test (touch opposite shoulder):  Action 1 (Across chest): deferred Action 2 (Overhead): deferred Action 3 (LB reach): deferred   Provocative Test(s):  Phalen's test: deferred Tinel's test: deferred Apley's scratch test (touch opposite shoulder):  Action 1 (Across chest): deferred Action 2 (Overhead): deferred Action 3 (LB reach): deferred    Thoracic Spine Area Exam  Skin & Axial Inspection: No masses, redness, or swelling Alignment: Symmetrical Functional ROM: Unrestricted ROM Stability: No instability detected Muscle Tone/Strength: Functionally intact. No obvious neuro-muscular anomalies detected. Sensory (Neurological): Unimpaired Muscle strength & Tone: No palpable anomalies  Lumbar Exam  Skin & Axial Inspection: No masses, redness, or swelling Alignment: Symmetrical Functional ROM: Decreased ROM affecting both sides Stability: No instability detected Muscle Tone/Strength: Guarding observed Sensory (Neurological): Movement-associated  discomfort Palpation: Complains of area being tender to palpation       Provocative Tests: Hyperextension/rotation test: (+) bilaterally for facet joint pain.  Gait & Posture Assessment  Ambulation: Unassisted Gait: Antalgic Posture: WNL   Assessment  Primary Diagnosis & Pertinent Problem List: The primary encounter diagnosis was Chronic pain syndrome. Diagnoses of Chronic bilateral low back pain with left-sided sciatica, Chronic pain of left lower extremity, Neurogenic bladder, Lumbosacral radiculopathy at S1 (Left), Pharmacologic therapy, Disorder of skeletal system, Problems influencing health status, and Lumbar facet syndrome (Bilateral) (L>R) were also pertinent to this visit.  Visit Diagnosis (New problems to examiner): 1. Chronic pain syndrome   2. Chronic bilateral low back pain with left-sided sciatica   3. Chronic pain of left lower extremity   4. Neurogenic bladder   5. Lumbosacral radiculopathy at S1 (Left)   6. Pharmacologic therapy   7. Disorder of skeletal system   8. Problems influencing health status   9. Lumbar facet syndrome (Bilateral) (L>R)    Plan of Care (Initial workup plan)  Note: Rita White was reminded that as per protocol, today's visit has been an evaluation only. We have not taken over the patient's controlled substance management.  Problem-specific plan: No problem-specific Assessment & Plan notes found for this encounter.   Lab Orders     Compliance Drug Analysis, Ur     Comp. Metabolic Panel (12)     Magnesium     Vitamin B12     Sedimentation rate     25-Hydroxy vitamin D Lcms D2+D3     C-reactive protein  Imaging Orders     DG Lumbar Spine Complete W/Bend  Referral Orders  No referral(s) requested today    Procedure Orders     LUMBAR FACET(MEDIAL BRANCH NERVE BLOCK) MBNB Pharmacotherapy (current): Medications ordered:  Meds ordered this encounter  Medications  . predniSONE (DELTASONE) 20 MG tablet    Sig: Take 3 tablets (60 mg  total) by mouth daily with breakfast for 3 days, THEN 2 tablets (40 mg total) daily with breakfast for 3 days, THEN 1 tablet (20 mg total) daily with breakfast for 3 days.    Dispense:  18 tablet    Refill:  0   Medications administered during this visit: Hadley M. Bouchie had no medications administered during this visit.   Pharmacological management options:  Opioid Analgesics: The patient was informed that there is no guarantee that she would be a candidate for opioid analgesics. The decision will be made following CDC guidelines. This decision will be based on the results of diagnostic studies, as well as Rita White's risk profile.   Membrane stabilizer: To be determined at a later time  Muscle relaxant: To be determined at a later time  NSAID: To be determined at a later time  Other analgesic(s): To be determined at a later time   Interventional management options: Rita White was informed that there is no guarantee that she would be a candidate for interventional therapies. The decision will be based on the results of diagnostic studies, as well as Rita White's risk profile.  Procedure(s) under consideration:  Diagnostic bilateral lumbar facet block #1    Provider-requested follow-up: Return for Procedure (w/ sedation): (B) L-FCT BLK #1.  Future Appointments  Date Time Provider [place]  03/21/2020 10:20 AM Milinda Pointer, MD [facility] None    Note by: Gaspar Cola, MD Date: 03/06/2020; Time: 1:48 PM

## 2020-03-06 NOTE — Patient Instructions (Addendum)
____________________________________________________________________________________________  Preparing for Procedure with Sedation  Procedure appointments are limited to planned procedures: . No Prescription Refills. . No disability issues will be discussed. . No medication changes will be discussed.  Instructions: . Oral Intake: Do not eat or drink anything for at least 8 hours prior to your procedure. (Exception: Blood Pressure Medication. See below.) . Transportation: Unless otherwise stated by your physician, you may drive yourself after the procedure. . Blood Pressure Medicine: Do not forget to take your blood pressure medicine with a sip of water the morning of the procedure. If your Diastolic (lower reading)is above 100 mmHg, elective cases will be cancelled/rescheduled. . Blood thinners: These will need to be stopped for procedures. Notify our staff if you are taking any blood thinners. Depending on which one you take, there will be specific instructions on how and when to stop it. . Diabetics on insulin: Notify the staff so that you can be scheduled 1st case in the morning. If your diabetes requires high dose insulin, take only  of your normal insulin dose the morning of the procedure and notify the staff that you have done so. . Preventing infections: Shower with an antibacterial soap the morning of your procedure. . Build-up your immune system: Take 1000 mg of Vitamin C with every meal (3 times a day) the day prior to your procedure. Marland Kitchen Antibiotics: Inform the staff if you have a condition or reason that requires you to take antibiotics before dental procedures. . Pregnancy: If you are pregnant, call and cancel the procedure. . Sickness: If you have a cold, fever, or any active infections, call and cancel the procedure. . Arrival: You must be in the facility at least 30 minutes prior to your scheduled procedure. . Children: Do not bring children with you. . Dress appropriately:  Bring dark clothing that you would not mind if they get stained. . Valuables: Do not bring any jewelry or valuables.  Reasons to call and reschedule or cancel your procedure: (Following these recommendations will minimize the risk of a serious complication.) . Surgeries: Avoid having procedures within 2 weeks of any surgery. (Avoid for 2 weeks before or after any surgery). . Flu Shots: Avoid having procedures within 2 weeks of a flu shots or . (Avoid for 2 weeks before or after immunizations). . Barium: Avoid having a procedure within 7-10 days after having had a radiological study involving the use of radiological contrast. (Myelograms, Barium swallow or enema study). . Heart attacks: Avoid any elective procedures or surgeries for the initial 6 months after a "Myocardial Infarction" (Heart Attack). . Blood thinners: It is imperative that you stop these medications before procedures. Let us know if you if you take any blood thinner.  . Infection: Avoid procedures during or within two weeks of an infection (including chest colds or gastrointestinal problems). Symptoms associated with infections include: Localized redness, fever, chills, night sweats or profuse sweating, burning sensation when voiding, cough, congestion, stuffiness, runny nose, sore throat, diarrhea, nausea, vomiting, cold or Flu symptoms, recent or current infections. It is specially important if the infection is over the area that we intend to treat. Marland Kitchen Heart and lung problems: Symptoms that may suggest an active cardiopulmonary problem include: cough, chest pain, breathing difficulties or shortness of breath, dizziness, ankle swelling, uncontrolled high or unusually low blood pressure, and/or palpitations. If you are experiencing any of these symptoms, cancel your procedure and contact your primary care physician for an evaluation.  Remember:  Regular Business hours are:  Monday to Thursday 8:00 AM to 4:00 PM  Provider's  Schedule: Delano Metz, MD:  Procedure days: Tuesday and Thursday 7:30 AM to 4:00 PM  Edward Jolly, MD:  Procedure days: Monday and Wednesday 7:30 AM to 4:00 PM ____________________________________________________________________________________________   ____________________________________________________________________________________________  General Risks and Possible Complications  Patient Responsibilities: It is important that you read this as it is part of your informed consent. It is our duty to inform you of the risks and possible complications associated with treatments offered to you. It is your responsibility as a patient to read this and to ask questions about anything that is not clear or that you believe was not covered in this document.  Patient's Rights: You have the right to refuse treatment. You also have the right to change your mind, even after initially having agreed to have the treatment done. However, under this last option, if you wait until the last second to change your mind, you may be charged for the materials used up to that point.  Introduction: Medicine is not an Visual merchandiser. Everything in Medicine, including the lack of treatment(s), carries the potential for danger, harm, or loss (which is by definition: Risk). In Medicine, a complication is a secondary problem, condition, or disease that can aggravate an already existing one. All treatments carry the risk of possible complications. The fact that a side effects or complications occurs, does not imply that the treatment was conducted incorrectly. It must be clearly understood that these can happen even when everything is done following the highest safety standards.  No treatment: You can choose not to proceed with the proposed treatment alternative. The "PRO(s)" would include: avoiding the risk of complications associated with the therapy. The "CON(s)" would include: not getting any of the treatment  benefits. These benefits fall under one of three categories: diagnostic; therapeutic; and/or palliative. Diagnostic benefits include: getting information which can ultimately lead to improvement of the disease or symptom(s). Therapeutic benefits are those associated with the successful treatment of the disease. Finally, palliative benefits are those related to the decrease of the primary symptoms, without necessarily curing the condition (example: decreasing the pain from a flare-up of a chronic condition, such as incurable terminal cancer).  General Risks and Complications: These are associated to most interventional treatments. They can occur alone, or in combination. They fall under one of the following six (6) categories: no benefit or worsening of symptoms; bleeding; infection; nerve damage; allergic reactions; and/or death. 1. No benefits or worsening of symptoms: In Medicine there are no guarantees, only probabilities. No healthcare provider can ever guarantee that a medical treatment will work, they can only state the probability that it may. Furthermore, there is always the possibility that the condition may worsen, either directly, or indirectly, as a consequence of the treatment. 2. Bleeding: This is more common if the patient is taking a blood thinner, either prescription or over the counter (example: Goody Powders, Fish oil, Aspirin, Garlic, etc.), or if suffering a condition associated with impaired coagulation (example: Hemophilia, cirrhosis of the liver, low platelet counts, etc.). However, even if you do not have one on these, it can still happen. If you have any of these conditions, or take one of these drugs, make sure to notify your treating physician. 3. Infection: This is more common in patients with a compromised immune system, either due to disease (example: diabetes, cancer, human immunodeficiency virus [HIV], etc.), or due to medications or treatments (example: therapies used to treat  cancer and  rheumatological diseases). However, even if you do not have one on these, it can still happen. If you have any of these conditions, or take one of these drugs, make sure to notify your treating physician. 4. Nerve Damage: This is more common when the treatment is an invasive one, but it can also happen with the use of medications, such as those used in the treatment of cancer. The damage can occur to small secondary nerves, or to large primary ones, such as those in the spinal cord and brain. This damage may be temporary or permanent and it may lead to impairments that can range from temporary numbness to permanent paralysis and/or brain death. 5. Allergic Reactions: Any time a substance or material comes in contact with our body, there is the possibility of an allergic reaction. These can range from a mild skin rash (contact dermatitis) to a severe systemic reaction (anaphylactic reaction), which can result in death. 6. Death: In general, any medical intervention can result in death, most of the time due to an unforeseen complication. ____________________________________________________________________________________________  Preparing for your procedure (without sedation) Instructions: . Oral Intake: Do not eat or drink anything for at least 3 hours prior to your procedure. . Transportation: Unless otherwise stated by your physician, you may drive yourself after the procedure. . Blood Pressure Medicine: Take your blood pressure medicine with a sip of water the morning of the procedure. . Insulin: Take only  of your normal insulin dose. . Preventing infections: Shower with an antibacterial soap the morning of your procedure. . Build-up your immune system: Take 1000 mg of Vitamin C with every meal (3 times a day) the day prior to your procedure. . Pregnancy: If you are pregnant, call and cancel the procedure. . Sickness: If you have a cold, fever, or any active infections, call and cancel  the procedure. . Arrival: You must be in the facility at least 30 minutes prior to your scheduled procedure. . Children: Do not bring any children with you. . Dress appropriately: Bring dark clothing that you would not mind if they get stained. . Valuables: Do not bring any jewelry or valuables. Procedure appointments are reserved for interventional treatments only. Marland Kitchen No Prescription Refills. . No medication changes will be discussed during procedure appointments. . No disability issues will be discussed. Marland Kitchen

## 2020-03-08 LAB — COMPLIANCE DRUG ANALYSIS, UR

## 2020-03-10 LAB — COMP. METABOLIC PANEL (12)
AST: 16 IU/L (ref 0–40)
Albumin/Globulin Ratio: 1.7 (ref 1.2–2.2)
Albumin: 4.5 g/dL (ref 3.8–4.8)
Alkaline Phosphatase: 78 IU/L (ref 48–121)
BUN/Creatinine Ratio: 19 (ref 9–23)
BUN: 14 mg/dL (ref 6–24)
Bilirubin Total: 0.4 mg/dL (ref 0.0–1.2)
Calcium: 9.5 mg/dL (ref 8.7–10.2)
Chloride: 105 mmol/L (ref 96–106)
Creatinine, Ser: 0.73 mg/dL (ref 0.57–1.00)
GFR calc Af Amer: 115 mL/min/{1.73_m2} (ref >59)
GFR calc non Af Amer: 100 mL/min/{1.73_m2} (ref >59)
Globulin, Total: 2.7 g/dL (ref 1.5–4.5)
Glucose: 80 mg/dL (ref 65–99)
Potassium: 4.2 mmol/L (ref 3.5–5.2)
Sodium: 139 mmol/L (ref 134–144)
Total Protein: 7.2 g/dL (ref 6.0–8.5)

## 2020-03-10 LAB — 25-HYDROXY VITAMIN D LCMS D2+D3
25-Hydroxy, Vitamin D-2: <1 ng/mL
25-Hydroxy, Vitamin D-3: 32 ng/mL
25-Hydroxy, Vitamin D: 32 ng/mL

## 2020-03-10 LAB — MAGNESIUM: Magnesium: 2.2 mg/dL (ref 1.6–2.3)

## 2020-03-10 LAB — VITAMIN B12: Vitamin B-12: 855 pg/mL (ref 232–1245)

## 2020-03-10 LAB — C-REACTIVE PROTEIN: CRP: 2 mg/L (ref 0–10)

## 2020-03-10 LAB — SEDIMENTATION RATE: Sed Rate: 17 mm/hr (ref 0–32)

## 2020-03-21 ENCOUNTER — Other Ambulatory Visit: Payer: Self-pay

## 2020-03-21 ENCOUNTER — Ambulatory Visit (HOSPITAL_BASED_OUTPATIENT_CLINIC_OR_DEPARTMENT_OTHER): Payer: Medicaid Other | Admitting: Pain Medicine

## 2020-03-21 ENCOUNTER — Ambulatory Visit
Admission: RE | Admit: 2020-03-21 | Discharge: 2020-03-21 | Disposition: A | Discharge status: Home / Self Care | Payer: Medicaid Other | Source: Ambulatory Visit | Attending: Pain Medicine | Admitting: Pain Medicine

## 2020-03-21 ENCOUNTER — Encounter: Payer: Self-pay | Admitting: Pain Medicine

## 2020-03-21 VITALS — BP 119/86 | HR 74 | Temp 97.8°F | Resp 16 | Ht 69.0 in | Wt 200.0 lb

## 2020-03-21 DIAGNOSIS — M51369 Other intervertebral disc degeneration, lumbar region without mention of lumbar back pain or lower extremity pain: Secondary | ICD-10-CM | POA: Insufficient documentation

## 2020-03-21 DIAGNOSIS — R937 Abnormal findings on diagnostic imaging of other parts of musculoskeletal system: Secondary | ICD-10-CM | POA: Insufficient documentation

## 2020-03-21 DIAGNOSIS — M47816 Spondylosis without myelopathy or radiculopathy, lumbar region: Secondary | ICD-10-CM | POA: Insufficient documentation

## 2020-03-21 DIAGNOSIS — M5136 Other intervertebral disc degeneration, lumbar region: Secondary | ICD-10-CM | POA: Insufficient documentation

## 2020-03-21 MED ORDER — LIDOCAINE HCL 2 % IJ SOLN
INTRAMUSCULAR | Status: AC
  Filled 2020-03-21: qty 10

## 2020-03-21 MED ORDER — TRIAMCINOLONE ACETONIDE 40 MG/ML IJ SUSP
80.0000 mg | Freq: Once | INTRAMUSCULAR | Status: AC
  Administered 2020-03-21: 80 mg

## 2020-03-21 MED ORDER — TRIAMCINOLONE ACETONIDE 40 MG/ML IJ SUSP
INTRAMUSCULAR | Status: AC
  Filled 2020-03-21: qty 2

## 2020-03-21 MED ORDER — ROPIVACAINE HCL 2 MG/ML IJ SOLN
INTRAMUSCULAR | Status: AC
  Filled 2020-03-21: qty 20

## 2020-03-21 MED ORDER — LIDOCAINE HCL 2 % IJ SOLN
20.0000 mL | Freq: Once | INTRAMUSCULAR | Status: AC
  Administered 2020-03-21: 400 mg

## 2020-03-21 MED ORDER — ROPIVACAINE HCL 2 MG/ML IJ SOLN
18.0000 mL | Freq: Once | INTRAMUSCULAR | Status: AC
  Administered 2020-03-21: 18 mL via PERINEURAL

## 2020-03-21 NOTE — Progress Notes (Signed)
PROVIDER NOTE: Information contained herein reflects review and annotations entered in association with encounter. Interpretation of such information and data should be left to medically-trained personnel. Information provided to patient can be located elsewhere in the medical record under "Patient Instructions". Document created using STT-dictation technology, any transcriptional errors that may result from process are unintentional.    Patient: Rita White  Service Category: Procedure  Provider: Oswaldo Done, MD  DOB: 02-08-1976  DOS: 03/21/2020  Location: [facility]  MRN: [id number]  Setting: Ambulatory - outpatient  Referring Provider: Titus Mould*  Type: Established Patient  Specialty: Interventional Pain Management  PCP: Su Monks, PA   Primary Reason for Visit: Interventional Pain Management Treatment. CC: Back Pain (lower)  Procedure:          Anesthesia, Analgesia, Anxiolysis:  Type: Lumbar Facet, Medial Branch Block(s) #1  Primary Purpose: Diagnostic Region: Posterolateral Lumbosacral Spine Level: L2, L3, L4, L5, & S1 Medial Branch Level(s). Injecting these levels blocks the L3-4, L4-5, and L5-S1 lumbar facet joints. Laterality: Bilateral  Type: Local Anesthesia Indication(s): Analgesia         Route: Infiltration ([place]/IM) IV Access: Declined Sedation: Declined  Local Anesthetic: Lidocaine 1-2%  Position: Prone   Indications: 1. Lumbar facet syndrome (Bilateral) (L>R)   2. Lumbar facet arthropathy   3. DDD (degenerative disc disease), lumbar   4. Abnormal MRI, lumbar spine (04/01/2019)    Pain Score: Pre-procedure: 4 /10 Post-procedure: 4 /10   Pre-op Assessment:  Rita White is a 44 y.o. (year old), female patient, seen today for interventional treatment. She  has a past surgical history that includes Arthroscopic repair ACL (Left); stent in kidney (Left); and Colonoscopy with propofol (N/A, 02/16/2017). Rita White has a  current medication list which includes the following prescription(s): escitalopram, famotidine, gabapentin, hydrocodone-acetaminophen, levonorgestrel, linaclotide, multivitamin, ondansetron, promethazine, sumatriptan, tizanidine, topiramate, and tramadol. Her primarily concern today is the Back Pain (lower)  Initial Vital Signs:  Pulse/HCG Rate: 63  Temp: 97.8 F (36.6 C) Resp: 16 BP: 114/80 SpO2: 98 %  BMI: Estimated body mass index is 29.53 kg/m as calculated from the following:   Height as of this encounter:  (1.753 m).   Weight as of this encounter: 200 lb (90.7 kg).  Risk Assessment: Allergies: Reviewed. She is allergic to codeine.  Allergy Precautions: None required Coagulopathies: Reviewed. None identified.  Blood-thinner therapy: None at this time Active Infection(s): Reviewed. None identified. [name] is afebrile  Site Confirmation: Rita White was asked to confirm the procedure and laterality before marking the site Procedure checklist: Completed Consent: Before the procedure and under the influence of no sedative(s), amnesic(s), or anxiolytics, the patient was informed of the treatment options, risks and possible complications. To fulfill our ethical and legal obligations, as recommended by the American Medical Association's Code of Ethics, I have informed the patient of my clinical impression; the nature and purpose of the treatment or procedure; the risks, benefits, and possible complications of the intervention; the alternatives, including doing nothing; the risk(s) and benefit(s) of the alternative treatment(s) or procedure(s); and the risk(s) and benefit(s) of doing nothing. The patient was provided information about the general risks and possible complications associated with the procedure. These may include, but are not limited to: failure to achieve desired goals, infection, bleeding, organ or nerve damage, allergic reactions, paralysis, and death. In addition, the  patient was informed of those risks and complications associated to Spine-related procedures, such as failure to decrease pain;  infection (i.e.: Meningitis, epidural or intraspinal abscess); bleeding (i.e.: epidural hematoma, subarachnoid hemorrhage, or any other type of intraspinal or peri-dural bleeding); organ or nerve damage (i.e.: Any type of peripheral nerve, nerve root, or spinal cord injury) with subsequent damage to sensory, motor, and/or autonomic systems, resulting in permanent pain, numbness, and/or weakness of one or several areas of the body; allergic reactions; (i.e.: anaphylactic reaction); and/or death. Furthermore, the patient was informed of those risks and complications associated with the medications. These include, but are not limited to: allergic reactions (i.e.: anaphylactic or anaphylactoid reaction(s)); adrenal axis suppression; blood sugar elevation that in diabetics may result in ketoacidosis or comma; water retention that in patients with history of congestive heart failure may result in shortness of breath, pulmonary edema, and decompensation with resultant heart failure; weight gain; swelling or edema; medication-induced neural toxicity; particulate matter embolism and blood vessel occlusion with resultant organ, and/or nervous system infarction; and/or aseptic necrosis of one or more joints. Finally, the patient was informed that Medicine is not an exact science; therefore, there is also the possibility of unforeseen or unpredictable risks and/or possible complications that may result in a catastrophic outcome. The patient indicated having understood very clearly. We have given the patient no guarantees and we have made no promises. Enough time was given to the patient to ask questions, all of which were answered to the patient's satisfaction. Rita White has indicated that she wanted to continue with the procedure. Attestation: I, the ordering provider, attest that I have discussed  with the patient the benefits, risks, side-effects, alternatives, likelihood of achieving goals, and potential problems during recovery for the procedure that I have provided informed consent. Date  Time: 03/21/2020  9:43 AM  Pre-Procedure Preparation:  Monitoring: As per clinic protocol. Respiration, ETCO2, SpO2, BP, heart rate and rhythm monitor placed and checked for adequate function Safety Precautions: Patient was assessed for positional comfort and pressure points before starting the procedure. Time-out: I initiated and conducted the "Time-out" before starting the procedure, as per protocol. The patient was asked to participate by confirming the accuracy of the "Time Out" information. Verification of the correct person, site, and procedure were performed and confirmed by me, the nursing staff, and the patient. "Time-out" conducted as per Joint Commission's Universal Protocol (UP.01.01.01). Time: 1044  Description of Procedure:          Laterality: Bilateral. The procedure was performed in identical fashion on both sides. Levels:  L2, L3, L4, L5, & S1 Medial Branch Level(s) Area Prepped: Posterior Lumbosacral Region DuraPrep (Iodine Povacrylex [0.7% available iodine] and Isopropyl Alcohol, 74% w/w) Safety Precautions: Aspiration looking for blood return was conducted prior to all injections. At no point did we inject any substances, as a needle was being advanced. Before injecting, the patient was told to immediately notify me if she was experiencing any new onset of "ringing in the ears, or metallic taste in the mouth". No attempts were made at seeking any paresthesias. Safe injection practices and needle disposal techniques used. Medications properly checked for expiration dates. SDV (single dose vial) medications used. After the completion of the procedure, all disposable equipment used was discarded in the proper designated medical waste containers. Local Anesthesia: Protocol guidelines were  followed. The patient was positioned over the fluoroscopy table. The area was prepped in the usual manner. The time-out was completed. The target area was identified using fluoroscopy. A 12-in long, straight, sterile hemostat was used with fluoroscopic guidance to locate the targets for each level  blocked. Once located, the skin was marked with an approved surgical skin marker. Once all sites were marked, the skin (epidermis, dermis, and hypodermis), as well as deeper tissues (fat, connective tissue and muscle) were infiltrated with a small amount of a short-acting local anesthetic, loaded on a 10cc syringe with a 25G, 1.5-in  Needle. An appropriate amount of time was allowed for local anesthetics to take effect before proceeding to the next step. Local Anesthetic: Lidocaine 2.0% The unused portion of the local anesthetic was discarded in the proper designated containers. Technical explanation of process:  L2 Medial Branch Nerve Block (MBB): The target area for the L2 medial branch is at the junction of the postero-lateral aspect of the superior articular process and the superior, posterior, and medial edge of the transverse process of L3. Under fluoroscopic guidance, a Quincke needle was inserted until contact was made with os over the superior postero-lateral aspect of the pedicular shadow (target area). After negative aspiration for blood, 0.5 mL of the nerve block solution was injected without difficulty or complication. The needle was removed intact. L3 Medial Branch Nerve Block (MBB): The target area for the L3 medial branch is at the junction of the postero-lateral aspect of the superior articular process and the superior, posterior, and medial edge of the transverse process of L4. Under fluoroscopic guidance, a Quincke needle was inserted until contact was made with os over the superior postero-lateral aspect of the pedicular shadow (target area). After negative aspiration for blood, 0.5 mL of the nerve  block solution was injected without difficulty or complication. The needle was removed intact. L4 Medial Branch Nerve Block (MBB): The target area for the L4 medial branch is at the junction of the postero-lateral aspect of the superior articular process and the superior, posterior, and medial edge of the transverse process of L5. Under fluoroscopic guidance, a Quincke needle was inserted until contact was made with os over the superior postero-lateral aspect of the pedicular shadow (target area). After negative aspiration for blood, 0.5 mL of the nerve block solution was injected without difficulty or complication. The needle was removed intact. L5 Medial Branch Nerve Block (MBB): The target area for the L5 medial branch is at the junction of the postero-lateral aspect of the superior articular process and the superior, posterior, and medial edge of the sacral ala. Under fluoroscopic guidance, a Quincke needle was inserted until contact was made with os over the superior postero-lateral aspect of the pedicular shadow (target area). After negative aspiration for blood, 0.5 mL of the nerve block solution was injected without difficulty or complication. The needle was removed intact. S1 Medial Branch Nerve Block (MBB): The target area for the S1 medial branch is at the posterior and inferior 6 o'clock position of the L5-S1 facet joint. Under fluoroscopic guidance, the Quincke needle inserted for the L5 MBB was redirected until contact was made with os over the inferior and postero aspect of the sacrum, at the 6 o' clock position under the L5-S1 facet joint (Target area). After negative aspiration for blood, 0.5 mL of the nerve block solution was injected without difficulty or complication. The needle was removed intact.  Nerve block solution: 0.2% PF-Ropivacaine + Triamcinolone (40 mg/mL) diluted to a final concentration of 4 mg of Triamcinolone/mL of Ropivacaine The unused portion of the solution was discarded  in the proper designated containers. Procedural Needles: 22-gauge, 3.5-inch, Quincke needles used for all levels.  Once the entire procedure was completed, the treated area was cleaned, making  sure to leave some of the prepping solution back to take advantage of its long term bactericidal properties.   Illustration of the posterior view of the lumbar spine and the posterior neural structures. Laminae of L2 through S1 are labeled. DPRL5, dorsal primary ramus of L5; DPRS1, dorsal primary ramus of S1; DPR3, dorsal primary ramus of L3; FJ, facet (zygapophyseal) joint L3-L4; I, inferior articular process of L4; LB1, lateral branch of dorsal primary ramus of L1; IAB, inferior articular branches from L3 medial branch (supplies L4-L5 facet joint); IBP, intermediate branch plexus; MB3, medial branch of dorsal primary ramus of L3; NR3, third lumbar nerve root; S, superior articular process of L5; SAB, superior articular branches from L4 (supplies L4-5 facet joint also); TP3, transverse process of L3.  Vitals:   03/21/20 0941 03/21/20 1040 03/21/20 1050 03/21/20 1100  BP: 114/80 109/82 (!) 122/94 119/86  Pulse: 63 (!) 59 72 74  Resp: 16 17 16 16   Temp: 97.8 F (36.6 C)     TempSrc: Temporal     SpO2: 98% 100% 100% 100%  Weight: 200 lb (90.7 kg)     Height: 5\' 9"  (1.753 m)        Start Time: 1044 hrs. End Time: 1058 hrs.  Imaging Guidance (Spinal):          Type of Imaging Technique: Fluoroscopy Guidance (Spinal) Indication(s): Assistance in needle guidance and placement for procedures requiring needle placement in or near specific anatomical locations not easily accessible without such assistance. Exposure Time: Please see nurses notes. Contrast: None used. Fluoroscopic Guidance: I was personally present during the use of fluoroscopy. "Tunnel Vision Technique" used to obtain the best possible view of the target area. Parallax error corrected before commencing the procedure.  "Direction-depth-direction" technique used to introduce the needle under continuous pulsed fluoroscopy. Once target was reached, antero-posterior, oblique, and lateral fluoroscopic projection used confirm needle placement in all planes. Images permanently stored in EMR. Interpretation: No contrast injected. I personally interpreted the imaging intraoperatively. Adequate needle placement confirmed in multiple planes. Permanent images saved into the patient's record.  Antibiotic Prophylaxis:   Anti-infectives (From admission, onward)   None     Indication(s): None identified  Post-operative Assessment:  Post-procedure Vital Signs:  Pulse/HCG Rate: 74  Temp: 97.8 F (36.6 C) Resp: 16 BP: 119/86 SpO2: 100 %  EBL: None  Complications: No immediate post-treatment complications observed by team, or reported by patient.  Note: The patient tolerated the entire procedure well. A repeat set of vitals were taken after the procedure and the patient was kept under observation following institutional policy, for this type of procedure. Post-procedural neurological assessment was performed, showing return to baseline, prior to discharge. The patient was provided with post-procedure discharge instructions, including a section on how to identify potential problems. Should any problems arise concerning this procedure, the patient was given instructions to immediately contact , at any time, without hesitation. In any case, we plan to contact the patient by telephone for a follow-up status report regarding this interventional procedure.  Comments:  No additional relevant information.  Plan of Care  Orders:  Orders Placed This Encounter  Procedures  . LUMBAR FACET(MEDIAL BRANCH NERVE BLOCK) MBNB    Scheduling Instructions:     Procedure: Lumbar facet block (AKA.: Lumbosacral medial branch nerve block)     Side: Bilateral     Level: L3-4, L4-5, & L5-S1 Facets (L2, L3, L4, L5, & S1 Medial Branch  Nerves)     Sedation: Patient's choice.  Timeframe: Today    Order Specific Question:   Where will this procedure be performed?    Answer:   [facility]  . DG PA[facility] C-ARM 1-60 MIN NO REPORT    Intraoperative interpretation by procedural physician at [facility].    Standing Status:   Standing    Number of Occurrences:   1    Order Specific Question:   Reason for exam:    Answer:   Assistance in needle guidance and placement for procedures requiring needle placement in or near specific anatomical locations not easily accessible without such assistance.  . Informed Consent Details: Physician/Practitioner Attestation; Transcribe to consent form and obtain patient signature    Nursing Order: Transcribe to consent form and obtain patient signature. Note: Always confirm laterality of pain with Ms. Avetisyan, before procedure. 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 Provider Attestation: I, Luanna Weesner A. Laban Emperor, MD, (Pain Management Specialist), the physician/practitioner, attest that I have discussed with the patient the benefits, risks, side effects, alternatives, likelihood of achieving goals and potential problems during recovery for the procedure that I have provided informed consent.  . Provide equipment / supplies at bedside    Equipment required: Single use, disposable, "Block Tray" Needle type: Spinal Amount/quantity: 4 Size: Medium (5-inch) Gauge: 22G    Standing Status:   Standing    Number of Occurrences:   1    Order Specific Question:   Specify    Answer:   Block Tray   Chronic Opioid Analgesic:  None MME/day: 0 mg/day   Medications ordered for procedure: Meds ordered this  encounter  Medications  . lidocaine (XYLOCAINE) 2 % (with pres) injection 400 mg  . ropivacaine (PF) 2 mg/mL (0.2%) (NAROPIN) injection 18 mL  . triamcinolone acetonide (KENALOG-40) injection 80 mg   Medications administered: We administered lidocaine, ropivacaine (PF) 2 mg/mL (0.2%), and triamcinolone acetonide.  See the medical record for exact dosing, route, and time of administration.  Follow-up plan:   Return in about 2 weeks (around 04/04/2020) for (F2F), (PP) Follow-up.       Interventional Pending:      Under consideration:   Diagnostic bilateral lumbar facet block #2    Palliative care options:   None at this time    Recent Visits Date Type Provider Dept  03/06/20 Office Visit Delano Metz, MD [facility]  Showing recent visits within past 90 days and meeting all other requirements Today's Visits Date Type Provider Dept  03/21/20 Procedure visit Delano Metz, MD [facility]  Showing today's visits and meeting all other requirements Future Appointments Date Type Provider Dept  05/08/20 Appointment Delano Metz, MD [facility]  Showing future appointments within next 90 days and meeting all other requirements  Disposition: Discharge home  Discharge (Date  Time): 03/21/2020; 1106 hrs.   Primary Care Physician: Su Monks, PA Location: [facility] Note by: Oswaldo Done, MD Date: 03/21/2020; Time: 2:00 PM  Disclaimer:  Medicine is not an Visual merchandiser. The only guarantee in medicine is that nothing is guaranteed. It is important to note that the decision to proceed with this intervention was based on the information collected from the patient. The Data and conclusions were drawn from the patient's questionnaire,  the interview, and the physical examination. Because the information was provided in large part by the patient, it cannot be guaranteed that it has not been purposely or  unconsciously manipulated. Every effort has been made to obtain as much relevant data as possible for this evaluation. It is important to note that the conclusions that lead to this procedure are derived in large part from the available data. Always take into account that the treatment will also be dependent on availability of resources and existing treatment guidelines, considered by other Pain Management Practitioners as being common knowledge and practice, at the time of the intervention. For Medico-Legal purposes, it is also important to point out that variation in procedural techniques and pharmacological choices are the acceptable norm. The indications, contraindications, technique, and results of the above procedure should only be interpreted and judged by a Board-Certified Interventional Pain Specialist with extensive familiarity and expertise in the same exact procedure and technique.

## 2020-03-21 NOTE — Patient Instructions (Signed)

## 2020-03-21 NOTE — Progress Notes (Signed)
Safety precautions to be maintained throughout the outpatient stay will include: orient to surroundings, keep bed in low position, maintain call bell within reach at all times, provide assistance with transfer out of bed and ambulation.  

## 2020-03-22 ENCOUNTER — Telehealth: Payer: Self-pay

## 2020-03-22 NOTE — Telephone Encounter (Signed)
Post procedure phone call.  Patient states she is doing ok.  Instructed to put heat on it today and to call us for any questions or concerns.

## 2020-03-28 ENCOUNTER — Encounter: Payer: Self-pay | Admitting: Pain Medicine

## 2020-03-29 ENCOUNTER — Telehealth: Payer: Self-pay

## 2020-03-29 NOTE — Telephone Encounter (Signed)
Attempted to call patient to discuss the message she sent.  LM

## 2020-04-01 ENCOUNTER — Telehealth: Payer: Self-pay

## 2020-04-01 NOTE — Telephone Encounter (Signed)
Spoke with patient.  States she is having pain different from her original pain.  States her pain feels like a rubber band around her hips that has tightened.  It travels down her right hip and left leg.  States she feels a hot poker stabbing into the top of her crack and into her buttock.  Patient is taking Gabapentin and Tizanidine at night and this is not helping.   Will ask front desk to schedule virtual appointment with Dr Cherylann Ratel tommorow.

## 2020-04-02 ENCOUNTER — Other Ambulatory Visit: Payer: Self-pay

## 2020-04-02 ENCOUNTER — Ambulatory Visit
Payer: Medicaid Other | Attending: Student in an Organized Health Care Education/Training Program | Admitting: Student in an Organized Health Care Education/Training Program

## 2020-04-02 ENCOUNTER — Encounter: Payer: Self-pay | Admitting: Student in an Organized Health Care Education/Training Program

## 2020-04-02 DIAGNOSIS — G894 Chronic pain syndrome: Secondary | ICD-10-CM

## 2020-04-02 DIAGNOSIS — M47816 Spondylosis without myelopathy or radiculopathy, lumbar region: Secondary | ICD-10-CM | POA: Diagnosis not present

## 2020-04-02 DIAGNOSIS — M5442 Lumbago with sciatica, left side: Secondary | ICD-10-CM | POA: Diagnosis not present

## 2020-04-02 DIAGNOSIS — M5417 Radiculopathy, lumbosacral region: Secondary | ICD-10-CM

## 2020-04-02 DIAGNOSIS — M5136 Other intervertebral disc degeneration, lumbar region: Secondary | ICD-10-CM | POA: Diagnosis not present

## 2020-04-02 DIAGNOSIS — G8929 Other chronic pain: Secondary | ICD-10-CM

## 2020-04-02 DIAGNOSIS — R937 Abnormal findings on diagnostic imaging of other parts of musculoskeletal system: Secondary | ICD-10-CM

## 2020-04-02 MED ORDER — PREDNISONE 20 MG PO TABS
ORAL_TABLET | ORAL | 0 refills | Status: AC
Start: 2020-04-02 — End: 2020-04-11

## 2020-04-02 NOTE — Progress Notes (Signed)
Patient: Rita White  Service Category: E/M  Provider: Gillis Santa, MD  DOB: June 12, 1976  DOS: 04/02/2020  Location: Office  MRN: [id number]  Setting: Ambulatory outpatient  Referring Provider: Ulyess Blossom, PA  Type: Established Patient  Specialty: Interventional Pain Management  PCP: Ulyess Blossom, PA  Location: Home  Delivery: TeleHealth     Virtual Encounter - Pain Management PROVIDER NOTE: Information contained herein reflects review and annotations entered in association with encounter. Interpretation of such information and data should be left to medically-trained personnel. Information provided to patient can be located elsewhere in the medical record under "Patient Instructions". Document created using STT-dictation technology, any transcriptional errors that may result from process are unintentional.    Contact & Pharmacy Preferred: [phone] Home: [phone] (home) Mobile: [phone] (mobile) E-mail: [email or web address]  [id number] [address] [place] [address]Phone: [phone]: [phone]  Pre-screening  Ms. Ciaravino offered "in-person" vs "virtual" encounter. She indicated preferring virtual for this encounter.   Reason COVID-19*  Social distancing based on CDC and AMA recommendations.   I contacted [employer] 04/02/2020 via telephone.      I clearly identified myself as BGillis Santa MD. I verified that I was speaking with the correct person using two identifiers (Name: CNARIYA NEUMEYER and date of birth: [birth year].  Consent I sought verbal advanced consent from [employer]for virtual visit interactions. I informed Ms. Hodo of possible security and privacy concerns, risks, and limitations associated with providing "not-in-person" medical evaluation and management services. I also informed Ms. Clinch of the availability of "in-person" appointments. Finally, I informed her that there would  be a charge for the virtual visit and that she could be  personally, fully or partially, financially responsible for it. Ms. CCrabbeexpressed understanding and agreed to proceed.   Historic Elements   Ms. Marygrace MCRYSTLE CARELLIis a 44y.o. year old, female patient evaluated today after our last contact on bilateral L3, L4, L5, S1 facet medial branch nerve blocks 03/21/2020 with Dr. NDossie Arbour Ms. CTidd has a past medical history of Headache, PCOS (polycystic ovarian syndrome), and PIH (pregnancy induced hypertension). She also  has a past surgical history that includes Arthroscopic repair ACL (Left); stent in kidney (Left); and Colonoscopy with propofol (N/A, 02/16/2017). Ms. CTaliaferrohas a current medication list which includes the following prescription(s): escitalopram, famotidine, gabapentin, hydrocodone-acetaminophen, levonorgestrel, linaclotide, multivitamin, ondansetron, promethazine, sumatriptan, tizanidine, topiramate, tramadol, and prednisone. She  reports that she has never smoked. She has never used smokeless tobacco. She reports that she does not drink alcohol and does not use drugs. Ms. CHiratais allergic to codeine.   HPI  Today, she is being contacted for a post-procedure assessment.   Herman is a patient of my colleague, Dr. NDossie Arbour  She was last seen for bilateral L2, L3, L4, L5, S1 facet medial branch nerve block on 03/21/2020.  Patient states that she has had increased pain that is severe and describes it as a "hot poker knife" in her low back region.  She states that her pain is debilitating and she finds it difficult to perform ADLs.  She states that she is having a different type of pain than her normal low back pain for which she did the lumbar facet medial branch nerve blocks for her.  She denies any pain radiation to her lower extremities.  She denies any new onset bowel or bladder dysfunction.  Full strength in her lower extremities.  She does endorse a tight bandlike pattern in her low back  that is constant.  She states that her pain is not necessarily improved since her procedure today.   Post-Procedure Evaluation  Procedure 03/21/2020  Type: Lumbar Facet, Medial Branch Block(s) #1  Primary Purpose: Diagnostic Region: Posterolateral Lumbosacral Spine Level: L2, L3, L4, L5, & S1 Medial Branch Level(s). Injecting these levels blocks the L3-4, L4-5, and L5-S1 lumbar facet joints. Laterality: Bilateral  Sedation: Please see nurses note.  Effectiveness during initial hour after procedure(Ultra-Short Term Relief): 100 %   Local anesthetic used: Long-acting (4-6 hours) Effectiveness: Defined as any analgesic benefit obtained secondary to the administration of local anesthetics. This carries significant diagnostic value as to the etiological location, or anatomical origin, of the pain. Duration of benefit is expected to coincide with the duration of the local anesthetic used.  Effectiveness during initial 4-6 hours after procedure(Short-Term Relief): 100 % .  Long-term benefit: Defined as any relief past the pharmacologic duration of the local anesthetics.  Effectiveness past the initial 6 hours after procedure(Long-Term Relief): 0 % *  Current benefits: Defined as benefit that persist at this time.   Analgesia:  No benefit Function: No benefit ROM: No benefit    Laboratory Chemistry Profile   Renal Lab Results  Component Value Date   BUN 14 03/06/2020   CREATININE 0.73 03/06/2020   BCR 19 03/06/2020   GFRAA 115 03/06/2020   GFRNONAA 100 03/06/2020     Hepatic Lab Results  Component Value Date   AST 16 03/06/2020   ALT 46 08/12/2014   ALBUMIN 4.5 03/06/2020   ALKPHOS 78 03/06/2020     Electrolytes Lab Results  Component Value Date   NA 139 03/06/2020   K 4.2 03/06/2020   CL 105 03/06/2020   CALCIUM 9.5 03/06/2020   MG 2.2 03/06/2020     Bone Lab Results  Component Value Date   25OHVITD1 32 03/06/2020   25OHVITD2 <1.0 03/06/2020   25OHVITD3 32  03/06/2020     Inflammation (CRP: Acute Phase) (ESR: Chronic Phase) Lab Results  Component Value Date   CRP 2 03/06/2020   ESRSEDRATE 17 03/06/2020       Note: Above Lab results reviewed.   Assessment  The primary encounter diagnosis was Lumbar facet syndrome (Bilateral) (L>R). Diagnoses of Lumbar facet arthropathy, DDD (degenerative disc disease), lumbar, Chronic bilateral low back pain with left-sided sciatica, Abnormal MRI, lumbar spine (04/01/2019), Lumbosacral radiculopathy at S1 (Left), and Chronic pain syndrome were also pertinent to this visit.  Plan of Care  Ms. [place] has a current medication list which includes the following long-term medication(s): escitalopram, famotidine, gabapentin, levonorgestrel, linaclotide, promethazine, sumatriptan, and topiramate.  Increased low back pain after diagnostic lumbar facet medial branch nerve blocks performed on 03/21/2020.  No lower extremity weakness, acute changes in bowel or bladder dysfunction.  Likely exacerbation of lumbar facet related pain.  Recommend patient increase her gabapentin to 300 mg twice daily and also increase her tizanidine to 4 mg twice daily as needed for increased low back pain.  I will also prescribe the patient a prednisone taper that she can complete over the course of 9 days.  She is instructed to continue her follow-up appointment with Dr. Dossie Arbour on 05/08/2020.  If she is not finding pain relief within a week.  I encouraged the patient to create a face-to-face appointment with me at the pain clinic so that I can assess her  pain exacerbation further.  Pharmacotherapy (Medications Ordered): Meds ordered this encounter  Medications  . predniSONE (DELTASONE) 20 MG tablet    Sig: Take 3 tablets (60 mg total) by mouth daily with breakfast for 3 days, THEN 2 tablets (40 mg total) daily with breakfast for 3 days, THEN 1 tablet (20 mg total) daily with breakfast for 3 days.    Dispense:  18 tablet    Refill:  0     Follow-up plan:   Return for Keep sch. appt with Dr Delane Ginger.   Recent Visits Date Type Provider Dept  03/21/20 Procedure visit Milinda Pointer, MD [facility]  03/06/20 Office Visit Milinda Pointer, MD [facility]  Showing recent visits within past 90 days and meeting all other requirements Today's Visits Date Type Provider Dept  04/02/20 Telemedicine Gillis Santa, MD [facility]  Showing today's visits and meeting all other requirements Future Appointments Date Type Provider Dept  05/08/20 Appointment Milinda Pointer, MD [facility]  Showing future appointments within next 90 days and meeting all other requirements  I discussed the assessment and treatment plan with the patient. The patient was provided an opportunity to ask questions and all were answered. The patient agreed with the plan and demonstrated an understanding of the instructions.  Patient advised to call back or seek an in-person evaluation if the symptoms or condition worsens.  Duration of encounter: 38mnutes.  Note by: BGillis Santa MD Date: 04/02/2020; Time: 11:19 AM

## 2020-05-06 NOTE — Progress Notes (Deleted)
No show

## 2020-05-08 ENCOUNTER — Encounter: Payer: Self-pay | Admitting: Pain Medicine

## 2020-05-08 ENCOUNTER — Ambulatory Visit: Payer: Medicaid Other | Admitting: Pain Medicine

## 2020-05-08 DIAGNOSIS — G8929 Other chronic pain: Secondary | ICD-10-CM

## 2020-05-08 DIAGNOSIS — Z79899 Other long term (current) drug therapy: Secondary | ICD-10-CM

## 2020-05-08 DIAGNOSIS — M47816 Spondylosis without myelopathy or radiculopathy, lumbar region: Secondary | ICD-10-CM

## 2020-05-08 DIAGNOSIS — G894 Chronic pain syndrome: Secondary | ICD-10-CM

## 2020-05-29 ENCOUNTER — Encounter: Payer: Self-pay | Admitting: Pain Medicine

## 2021-05-14 ENCOUNTER — Emergency Department: Payer: Medicaid Other

## 2021-05-14 ENCOUNTER — Emergency Department
Admission: EM | Admit: 2021-05-14 | Discharge: 2021-05-14 | Disposition: A | Discharge status: Home / Self Care | Payer: Medicaid Other | Attending: Emergency Medicine | Admitting: Emergency Medicine

## 2021-05-14 ENCOUNTER — Other Ambulatory Visit: Payer: Self-pay

## 2021-05-14 ENCOUNTER — Other Ambulatory Visit: Payer: Self-pay | Admitting: Family Medicine

## 2021-05-14 DIAGNOSIS — R209 Unspecified disturbances of skin sensation: Secondary | ICD-10-CM | POA: Diagnosis not present

## 2021-05-14 DIAGNOSIS — M5416 Radiculopathy, lumbar region: Secondary | ICD-10-CM

## 2021-05-14 DIAGNOSIS — Z5321 Procedure and treatment not carried out due to patient leaving prior to being seen by health care provider: Secondary | ICD-10-CM | POA: Insufficient documentation

## 2021-05-14 DIAGNOSIS — R32 Unspecified urinary incontinence: Secondary | ICD-10-CM | POA: Diagnosis not present

## 2021-05-14 DIAGNOSIS — M545 Low back pain, unspecified: Secondary | ICD-10-CM | POA: Insufficient documentation

## 2021-05-14 NOTE — ED Triage Notes (Signed)
Pt states she was sent by her PCP, pt has been having increased lower back over the past month, now having urinary incontinence and numbness in the groin area.

## 2022-02-12 ENCOUNTER — Other Ambulatory Visit: Payer: Self-pay | Admitting: Physician Assistant

## 2022-02-12 DIAGNOSIS — R110 Nausea: Secondary | ICD-10-CM

## 2022-02-12 DIAGNOSIS — G43109 Migraine with aura, not intractable, without status migrainosus: Secondary | ICD-10-CM

## 2022-02-12 DIAGNOSIS — F40298 Other specified phobia: Secondary | ICD-10-CM

## 2022-02-12 DIAGNOSIS — H53149 Visual discomfort, unspecified: Secondary | ICD-10-CM

## 2022-02-25 ENCOUNTER — Ambulatory Visit
Admission: RE | Admit: 2022-02-25 | Discharge: 2022-02-25 | Disposition: A | Discharge status: Home / Self Care | Payer: Medicaid Other | Source: Ambulatory Visit | Attending: Physician Assistant | Admitting: Physician Assistant

## 2022-02-25 DIAGNOSIS — F40298 Other specified phobia: Secondary | ICD-10-CM | POA: Diagnosis present

## 2022-02-25 DIAGNOSIS — R110 Nausea: Secondary | ICD-10-CM | POA: Diagnosis present

## 2022-02-25 DIAGNOSIS — H53149 Visual discomfort, unspecified: Secondary | ICD-10-CM | POA: Insufficient documentation

## 2022-02-25 DIAGNOSIS — G43109 Migraine with aura, not intractable, without status migrainosus: Secondary | ICD-10-CM | POA: Insufficient documentation

## 2022-04-29 ENCOUNTER — Other Ambulatory Visit: Payer: Self-pay

## 2022-04-29 DIAGNOSIS — K449 Diaphragmatic hernia without obstruction or gangrene: Secondary | ICD-10-CM

## 2022-04-29 DIAGNOSIS — R1013 Epigastric pain: Secondary | ICD-10-CM

## 2022-05-04 ENCOUNTER — Ambulatory Visit
Admission: RE | Admit: 2022-05-04 | Discharge: 2022-05-04 | Disposition: A | Discharge status: Home / Self Care | Payer: Medicaid Other | Source: Ambulatory Visit | Attending: Gastroenterology | Admitting: Gastroenterology

## 2022-05-04 DIAGNOSIS — K449 Diaphragmatic hernia without obstruction or gangrene: Secondary | ICD-10-CM | POA: Insufficient documentation

## 2022-05-04 DIAGNOSIS — R1013 Epigastric pain: Secondary | ICD-10-CM | POA: Insufficient documentation

## 2022-06-30 ENCOUNTER — Ambulatory Visit (INDEPENDENT_AMBULATORY_CARE_PROVIDER_SITE_OTHER): Payer: Medicaid Other

## 2022-06-30 ENCOUNTER — Ambulatory Visit
Admission: EM | Admit: 2022-06-30 | Discharge: 2022-06-30 | Disposition: A | Discharge status: Home / Self Care | Payer: Medicaid Other | Attending: Family Medicine | Admitting: Family Medicine

## 2022-06-30 DIAGNOSIS — M25561 Pain in right knee: Secondary | ICD-10-CM | POA: Diagnosis not present

## 2022-06-30 DIAGNOSIS — M25562 Pain in left knee: Secondary | ICD-10-CM | POA: Diagnosis not present

## 2022-06-30 DIAGNOSIS — M25521 Pain in right elbow: Secondary | ICD-10-CM

## 2022-06-30 DIAGNOSIS — W19XXXA Unspecified fall, initial encounter: Secondary | ICD-10-CM | POA: Diagnosis not present

## 2022-06-30 MED ORDER — KETOROLAC TROMETHAMINE 60 MG/2ML IM SOLN
30.0000 mg | Freq: Once | INTRAMUSCULAR | Status: AC
  Administered 2022-06-30: 30 mg via INTRAMUSCULAR

## 2022-06-30 MED ORDER — NAPROXEN 500 MG PO TABS: 500.0000 mg | ORAL_TABLET | Freq: Two times a day (BID) | ORAL | 0 refills | Status: AC

## 2022-06-30 MED ORDER — TIZANIDINE HCL 4 MG PO TABS
4.0000 mg | ORAL_TABLET | Freq: Three times a day (TID) | ORAL | 0 refills | Status: AC | PRN
Start: 2022-06-30 — End: ?

## 2022-06-30 NOTE — Discharge Instructions (Addendum)
If medication was prescribed, stop by the pharmacy to pick up your prescriptions.  For your  pain, Take 1500 mg Tylenol twice a day, take muscle relaxer (Robaxin) twice a day, take Naprosyn twice a day,  as needed for pain. Wear knee brace and use crutches. Apply cold compresses intermittently, as needed.  As pain recedes, begin normal activities slowly as tolerated.  Follow up with an orthopedic provider, if symptoms persist.  Watch for worsening symptoms such as an increasing weakness or loss of sensation, increasing pain and/or the loss of bladder or bowel function. Should any of these occur, go to the emergency department immediately.

## 2022-06-30 NOTE — ED Triage Notes (Signed)
Patient presents to [facility] for bilateral knee and right elbow pain. Pt states she tripped and fell. Not treating pain with any meds.

## 2022-06-30 NOTE — ED Provider Notes (Signed)
[facility]    CSN: [id number] Arrival date & time: 06/30/22  1423      History   Chief Complaint Chief Complaint  Patient presents with   Knee Pain   Arm Pain    Elbow     HPI  HPI Rita White is a 46 y.o. female.   Rita White presents after a fall yesterday with injury to her bilateral knees and right elbow.  Has not taken anything for pain.  States that she was outside of a hot dog shop and fell after tripping on the sidewalk.  She was holding her 63-year-old grandson who was unharmed.  She had direct blow to her knees.  Has abrasions on her right elbow and bilateral knees.  Her left knee hurts more than her right knee.  She did not hear any abnormal pops or sounds.  She has been having difficulty walking and others have been helping her get around.  She injured this left knee about a month ago on a baby gate.  She did not hit her head or have loss of consciousness.   Past Medical History:  Diagnosis Date   Headache    PCOS (polycystic ovarian syndrome)    PIH (pregnancy induced hypertension)     Patient Active Problem List   Diagnosis Date Noted   Abnormal MRI, lumbar spine (04/01/2019) 03/21/2020   DDD (degenerative disc disease), lumbar 03/21/2020   Lumbar facet arthropathy 03/21/2020   Chronic low back pain (1ry area of Pain) (Bilateral) (L>R) w/ sciatica (Left) 03/06/2020   Chronic lower extremity pain (2ry area of Pain) (Left) 03/06/2020   Neurogenic bladder 03/06/2020   Lumbosacral radiculopathy at S1 (Left) 03/06/2020   Chronic pain syndrome 03/06/2020   Pharmacologic therapy 03/06/2020   Disorder of skeletal system 03/06/2020   Problems influencing health status 03/06/2020   Lumbar facet syndrome (Bilateral) (L>R) 03/06/2020   Anxiety 08/03/2018   Intractable migraine with aura without status migrainosus 12/03/2016   Overweight 11/07/2013   Migraine without aura and without status migrainosus, not intractable 03/23/2011   PCOS (polycystic  ovarian syndrome) 03/23/2011    Past Surgical History:  Procedure Laterality Date   ARTHROSCOPIC REPAIR ACL Left    COLONOSCOPY WITH PROPOFOL N/A 02/16/2017   Procedure: COLONOSCOPY WITH PROPOFOL;  Surgeon: Christena Deem, MD;  Location: [facility] ENDOSCOPY;  Service: Endoscopy;  Laterality: N/A;   stent in kidney Left     OB History   No obstetric history on file.      Home Medications    Prior to Admission medications   Medication Sig Start Date End Date Taking? Authorizing Provider  naproxen (NAPROSYN) 500 MG tablet Take 1 tablet (500 mg total) by mouth 2 (two) times daily with a meal. 06/30/22  Yes Nikiyah Fackler, DO  pantoprazole (PROTONIX) 40 MG tablet Take by mouth. 04/29/22  Yes [provider]  escitalopram (LEXAPRO) 20 MG tablet Take 20 mg by mouth daily.    [provider]  famotidine (PEPCID) 20 MG tablet Take 20 mg by mouth daily.    [provider]  fluticasone (FLONASE) 50 MCG/ACT nasal spray Place 2 sprays into both nostrils daily.    [provider]  gabapentin (NEURONTIN) 300 MG capsule Take 300 mg by mouth at bedtime.     [provider]  HYDROcodone-acetaminophen (NORCO/VICODIN) 5-325 MG tablet Take 1 tablet by mouth every 6 (six) hours as needed for moderate pain.    [provider]  levonorgestrel (MIRENA) 20 MCG/24HR  IUD 1 each by Intrauterine route once.    [provider]  linaclotide (LINZESS) 145 MCG CAPS capsule Take 145 mcg by mouth daily before breakfast.    [provider]  Multiple Vitamin (MULTIVITAMIN) LIQD Take 5 mLs by mouth daily.    [provider]  ondansetron (ZOFRAN) 4 MG tablet Take 4 mg by mouth every 8 (eight) hours as needed for nausea or vomiting.    [provider]  ondansetron (ZOFRAN-ODT) 4 MG disintegrating tablet Take by mouth.    [provider]  promethazine (PHENERGAN) 25 MG tablet Take 25 mg by mouth every 8 (eight) hours as needed  for nausea or vomiting.    [provider]  SUMAtriptan (IMITREX) 50 MG tablet Take 50 mg by mouth every 2 (two) hours as needed for migraine. May repeat in 2 hours if headache persists or recurs.    [provider]  tiZANidine (ZANAFLEX) 4 MG tablet Take 1 tablet (4 mg total) by mouth every 8 (eight) hours as needed for muscle spasms. 06/30/22   Maurine Mowbray, Seward Meth, DO  topiramate (TOPAMAX) 100 MG tablet Take 100 mg by mouth 2 (two) times daily.    [provider]  traMADol (ULTRAM) 50 MG tablet Take by mouth every 6 (six) hours as needed.    [provider]    Family History History reviewed. No pertinent family history.  Social History Social History   Tobacco Use   Smoking status: Never   Smokeless tobacco: Never  Substance Use Topics   Alcohol use: No   Drug use: No     Allergies   Codeine   Review of Systems Review of Systems: :negative unless otherwise stated in HPI.      Physical Exam Triage Vital Signs ED Triage Vitals  Enc Vitals Group     BP 06/30/22 1647 99/70     Pulse Rate 06/30/22 1647 82     Resp 06/30/22 1647 16     Temp 06/30/22 1647 97.6 F (36.4 C)     Temp Source 06/30/22 1647 Oral     SpO2 06/30/22 1647 100 %     Weight --      Height --      Head Circumference --      Peak Flow --      Pain Score 06/30/22 1650 8     Pain Loc --      Pain Edu? --      Excl. in GC? --    No data found.  Updated Vital Signs BP 99/70 (BP Location: Left Arm)   Pulse 82   Temp 97.6 F (36.4 C) (Oral)   Resp 16   LMP 06/20/2022 (Approximate)   SpO2 100%   Visual Acuity Right Eye Distance:   Left Eye Distance:   Bilateral Distance:    Right Eye Near:   Left Eye Near:    Bilateral Near:     Physical Exam GEN: well appearing female in no acute distress  CVS: well perfused  RESP: speaking in full sentences without pause, no respiratory distress  MSK:  Right elbow: Full flexion and extension, abrasion near the  olecranon Bilateral  Knee Exam -Inspection: no deformity, left knee is erythematous with bruising -Palpation: + joint line tenderness on the left, no palpable effusion -ROM: Full flexion and extension on the right, limited flexion on the left due to acute pain and swelling - Strength right: 5/5,  left 4/5 -Special Tests: ] Right: Varus Stress:  Negative; Valgus Stress: Negative; Anterior: Negative; Posterior drawer: Negative,  Left: Varus Stress: Negative; Valgus Stress: Positive Anterior: Negative; Posterior drawer: Negative, Thessaly:not attempted  -Limb neurovascularly intact  Left knee:   Right knee:    Right elbow:     UC Treatments / Results  Labs (all labs ordered are listed, but only abnormal results are displayed) Labs Reviewed - No data to display  EKG   Radiology DG Elbow Complete Right  Result Date: 06/30/2022 CLINICAL DATA:  Fall EXAM: RIGHT ELBOW - COMPLETE 3+ VIEW COMPARISON:  None Available. FINDINGS: There is no evidence of fracture, dislocation, or joint effusion. There is no evidence of arthropathy or other focal bone abnormality. There is posterior elbow soft tissue swelling. IMPRESSION: Negative. Electronically Signed   By: Darliss CheneyAmy  Guttmann M.D.   On: 06/30/2022 17:20   DG Knee Complete 4 Views Right  Result Date: 06/30/2022 CLINICAL DATA:  Fall EXAM: RIGHT KNEE - COMPLETE 4+ VIEW COMPARISON:  None Available. FINDINGS: No evidence of fracture, dislocation, or joint effusion. No evidence of arthropathy or other focal bone abnormality. Soft tissues are unremarkable. IMPRESSION: Negative. Electronically Signed   By: Darliss CheneyAmy  Guttmann M.D.   On: 06/30/2022 17:19   DG Knee Complete 4 Views Left  Result Date: 06/30/2022 CLINICAL DATA:  Fall EXAM: LEFT KNEE - COMPLETE 4+ VIEW COMPARISON:  None Available. FINDINGS: No evidence of fracture, dislocation, or joint effusion. No evidence of arthropathy or other focal bone abnormality. There is anterior knee soft tissue  swelling. IMPRESSION: Anterior knee soft tissue swelling. No acute fracture or dislocation. Electronically Signed   By: Darliss CheneyAmy  Guttmann M.D.   On: 06/30/2022 17:18    Procedures Procedures (including critical care time)  Medications Ordered in UC Medications  ketorolac (TORADOL) injection 30 mg (30 mg Intramuscular Given 06/30/22 1758)    Initial Impression / Assessment and Plan / UC Course  I have reviewed the triage vital signs and the nursing notes.  Pertinent labs & imaging results that were available during my care of the patient were reviewed by me and considered in my medical decision making (see chart for details).      Pt is a 46 y.o.  female with 1 days of right elbow and bilateral knee pain after falling yesterday afternoon. Toradol IM with some relief.  Obtained bilateral knee and right elbow plain films.  Personally reviewed by me were unremarkable for fracture or dislocation. Radiologist notes  soft tissue swelling on the left and right elbow posterior soft tissue swelling. On exam, pt has tenderness midline on the left and overlying her abrasions of her knees and elbows.  I am concerned that she may have injured ligaments in her knee left knee.  She was given crutches and a knee brace and advised to follow-up with orthopedic provider.   Patient to gradually return to normal activities, as tolerated and continue ordinary activities within the limits permitted by pain. Prescribed Naproxen sodium  and muscle relaxer  for pain relief.  Tylenol PRN. Advised patient to avoid OTC NSAIDs while taking prescription NSAID.   Patient to follow up with orthopedic provider. ED precautions given. Understanding voiced. Discussed MDM, treatment plan and plan for follow-up with patient/parent who agrees with plan.   Final Clinical Impressions(s) / UC Diagnoses   Final diagnoses:  Fall, initial encounter  Acute pain of both knees  Right elbow pain     Discharge Instructions      If  medication was prescribed, stop by the pharmacy  to pick up your prescriptions.  For your  pain, Take 1500 mg Tylenol twice a day, take muscle relaxer (Robaxin) twice a day, take Naprosyn twice a day,  as needed for pain. Wear knee brace and use crutches. Apply cold compresses intermittently, as needed.  As pain recedes, begin normal activities slowly as tolerated.  Follow up with an orthopedic provider, if symptoms persist.  Watch for worsening symptoms such as an increasing weakness or loss of sensation, increasing pain and/or the loss of bladder or bowel function. Should any of these occur, go to the emergency department immediately.        ED Prescriptions     Medication Sig Dispense Auth. Provider   tiZANidine (ZANAFLEX) 4 MG tablet Take 1 tablet (4 mg total) by mouth every 8 (eight) hours as needed for muscle spasms. 30 tablet Kyros Salzwedel, DO   naproxen (NAPROSYN) 500 MG tablet Take 1 tablet (500 mg total) by mouth 2 (two) times daily with a meal. 30 tablet Cantrell Larouche, DO      PDMP not reviewed this encounter.   Katha Cabal, DO 06/30/22 1812

## 2022-09-16 ENCOUNTER — Other Ambulatory Visit: Payer: Self-pay | Admitting: Gastroenterology

## 2022-09-16 DIAGNOSIS — R1013 Epigastric pain: Secondary | ICD-10-CM

## 2022-09-21 ENCOUNTER — Ambulatory Visit
Admission: RE | Admit: 2022-09-21 | Discharge: 2022-09-21 | Disposition: A | Discharge status: Home / Self Care | Payer: Medicaid Other | Source: Ambulatory Visit | Attending: Gastroenterology | Admitting: Gastroenterology

## 2022-09-21 DIAGNOSIS — R1013 Epigastric pain: Secondary | ICD-10-CM

## 2024-02-03 ENCOUNTER — Other Ambulatory Visit: Payer: Self-pay | Admitting: Gastroenterology

## 2024-02-03 DIAGNOSIS — R1013 Epigastric pain: Secondary | ICD-10-CM

## 2024-02-28 ENCOUNTER — Ambulatory Visit: Admission: RE | Admit: 2024-02-28 | Source: Ambulatory Visit

## 2024-05-03 ENCOUNTER — Encounter: Payer: Self-pay | Admitting: Radiology

## 2024-05-03 ENCOUNTER — Other Ambulatory Visit: Payer: Self-pay | Admitting: Gastroenterology

## 2024-05-03 DIAGNOSIS — R1013 Epigastric pain: Secondary | ICD-10-CM

## 2024-05-08 ENCOUNTER — Encounter
Admission: RE | Admit: 2024-05-08 | Discharge: 2024-05-08 | Disposition: A | Discharge status: Home / Self Care | Source: Ambulatory Visit | Attending: Gastroenterology | Admitting: Gastroenterology

## 2024-05-08 ENCOUNTER — Ambulatory Visit
Admission: RE | Admit: 2024-05-08 | Discharge: 2024-05-08 | Disposition: A | Discharge status: Home / Self Care | Source: Ambulatory Visit | Attending: Gastroenterology | Admitting: Gastroenterology

## 2024-05-08 DIAGNOSIS — R1013 Epigastric pain: Secondary | ICD-10-CM | POA: Insufficient documentation

## 2024-05-08 MED ORDER — TECHNETIUM TC 99M MEBROFENIN IV KIT
5.1600 | PACK | Freq: Once | INTRAVENOUS | Status: AC | PRN
  Administered 2024-05-08: 5.16 via INTRAVENOUS

## 2024-06-02 ENCOUNTER — Encounter
Admission: RE | Admit: 2024-06-02 | Discharge: 2024-06-02 | Disposition: A | Discharge status: Home / Self Care | Source: Ambulatory Visit | Attending: Gastroenterology | Admitting: Gastroenterology

## 2024-06-02 DIAGNOSIS — R1013 Epigastric pain: Secondary | ICD-10-CM | POA: Diagnosis present

## 2024-06-02 MED ORDER — TECHNETIUM TC 99M SULFUR COLLOID
2.0000 | Freq: Once | INTRAVENOUS | Status: AC | PRN
  Administered 2024-06-02: 2 via INTRAVENOUS
